# Patient Record
Sex: Female | Born: 1965
Health system: Southern US, Community
[De-identification: ages and names within clinical notes are randomized; demographics above are authoritative.]

## PROBLEM LIST (undated history)

## (undated) DIAGNOSIS — F1010 Alcohol abuse, uncomplicated: Secondary | ICD-10-CM

## (undated) DIAGNOSIS — F419 Anxiety disorder, unspecified: Secondary | ICD-10-CM

## (undated) DIAGNOSIS — F329 Major depressive disorder, single episode, unspecified: Secondary | ICD-10-CM

## (undated) DIAGNOSIS — L509 Urticaria, unspecified: Secondary | ICD-10-CM

## (undated) DIAGNOSIS — F32A Depression, unspecified: Secondary | ICD-10-CM

## (undated) DIAGNOSIS — Z720 Tobacco use: Secondary | ICD-10-CM

## (undated) DIAGNOSIS — F410 Panic disorder [episodic paroxysmal anxiety] without agoraphobia: Secondary | ICD-10-CM

## (undated) DIAGNOSIS — K259 Gastric ulcer, unspecified as acute or chronic, without hemorrhage or perforation: Secondary | ICD-10-CM

## (undated) DIAGNOSIS — E785 Hyperlipidemia, unspecified: Secondary | ICD-10-CM

## (undated) HISTORY — DX: Alcohol abuse, uncomplicated: F10.10

## (undated) HISTORY — DX: Urticaria, unspecified: L50.9

## (undated) HISTORY — DX: Depression, unspecified: F32.A

## (undated) HISTORY — DX: Gastric ulcer, unspecified as acute or chronic, without hemorrhage or perforation: K25.9

## (undated) HISTORY — DX: Major depressive disorder, single episode, unspecified: F32.9

## (undated) HISTORY — DX: Tobacco use: Z72.0

## (undated) HISTORY — DX: Hyperlipidemia, unspecified: E78.5

## (undated) HISTORY — DX: Panic disorder (episodic paroxysmal anxiety): F41.0

## (undated) HISTORY — PX: HERNIA REPAIR: SHX51

## (undated) HISTORY — DX: Anxiety disorder, unspecified: F41.9

---

## 2016-06-12 ENCOUNTER — Telehealth: Payer: Self-pay

## 2016-06-12 ENCOUNTER — Ambulatory Visit (INDEPENDENT_AMBULATORY_CARE_PROVIDER_SITE_OTHER): Payer: BC Managed Care – PPO | Admitting: Family Medicine

## 2016-06-12 ENCOUNTER — Encounter: Payer: Self-pay | Admitting: Family Medicine

## 2016-06-12 VITALS — BP 120/72 | HR 67 | Wt 168.2 lb

## 2016-06-12 DIAGNOSIS — F419 Anxiety disorder, unspecified: Secondary | ICD-10-CM | POA: Diagnosis not present

## 2016-06-12 DIAGNOSIS — F1010 Alcohol abuse, uncomplicated: Secondary | ICD-10-CM | POA: Diagnosis not present

## 2016-06-12 DIAGNOSIS — Z720 Tobacco use: Secondary | ICD-10-CM

## 2016-06-12 DIAGNOSIS — F410 Panic disorder [episodic paroxysmal anxiety] without agoraphobia: Secondary | ICD-10-CM

## 2016-06-12 DIAGNOSIS — R5383 Other fatigue: Secondary | ICD-10-CM | POA: Diagnosis not present

## 2016-06-12 DIAGNOSIS — F329 Major depressive disorder, single episode, unspecified: Secondary | ICD-10-CM

## 2016-06-12 DIAGNOSIS — F32A Depression, unspecified: Secondary | ICD-10-CM

## 2016-06-12 LAB — CBC WITH DIFFERENTIAL/PLATELET
BASOS ABS: 0 {cells}/uL (ref 0–200)
Basophils Relative: 0 %
EOS ABS: 108 {cells}/uL (ref 15–500)
Eosinophils Relative: 1 %
HEMATOCRIT: 37.8 % (ref 35.0–45.0)
Hemoglobin: 12.3 g/dL (ref 11.7–15.5)
LYMPHS PCT: 37 %
Lymphs Abs: 3996 cells/uL — ABNORMAL HIGH (ref 850–3900)
MCH: 27.5 pg (ref 27.0–33.0)
MCHC: 32.5 g/dL (ref 32.0–36.0)
MCV: 84.4 fL (ref 80.0–100.0)
MONO ABS: 648 {cells}/uL (ref 200–950)
MPV: 9.9 fL (ref 7.5–12.5)
Monocytes Relative: 6 %
NEUTROS PCT: 56 %
Neutro Abs: 6048 cells/uL (ref 1500–7800)
Platelets: 273 10*3/uL (ref 140–400)
RBC: 4.48 MIL/uL (ref 3.80–5.10)
RDW: 15.9 % — AB (ref 11.0–15.0)
WBC: 10.8 10*3/uL — AB (ref 4.0–10.5)

## 2016-06-12 LAB — COMPREHENSIVE METABOLIC PANEL
ALT: 21 U/L (ref 6–29)
AST: 15 U/L (ref 10–35)
Albumin: 4.4 g/dL (ref 3.6–5.1)
Alkaline Phosphatase: 48 U/L (ref 33–130)
BUN: 23 mg/dL (ref 7–25)
CHLORIDE: 107 mmol/L (ref 98–110)
CO2: 22 mmol/L (ref 20–31)
CREATININE: 0.95 mg/dL (ref 0.50–1.05)
Calcium: 9.6 mg/dL (ref 8.6–10.4)
Glucose, Bld: 89 mg/dL (ref 65–99)
Potassium: 4 mmol/L (ref 3.5–5.3)
SODIUM: 141 mmol/L (ref 135–146)
TOTAL PROTEIN: 7.3 g/dL (ref 6.1–8.1)
Total Bilirubin: 0.3 mg/dL (ref 0.2–1.2)

## 2016-06-12 LAB — POCT URINALYSIS DIPSTICK
BILIRUBIN UA: NEGATIVE
Blood, UA: NEGATIVE
Glucose, UA: NEGATIVE
KETONES UA: NEGATIVE
Nitrite, UA: NEGATIVE
PROTEIN UA: NEGATIVE
Spec Grav, UA: >=1.03
Urobilinogen, UA: NEGATIVE
pH, UA: 6

## 2016-06-12 LAB — TSH: TSH: 0.82 mIU/L

## 2016-06-12 LAB — POCT URINE PREGNANCY: Preg Test, Ur: NEGATIVE

## 2016-06-12 MED ORDER — BUPROPION HCL ER (XL) 150 MG PO TB24: 150.0000 mg | ORAL_TABLET | Freq: Every day | ORAL | 0 refills | Status: DC

## 2016-06-12 MED ORDER — BUPROPION HCL ER (XL) 300 MG PO TB24: 300.0000 mg | ORAL_TABLET | Freq: Every day | ORAL | 2 refills | Status: DC

## 2016-06-12 NOTE — Progress Notes (Signed)
Subjective:    Patient ID: Diana Conrad, female    DOB: 07/08/1966, 50 y.o.   MRN: [id number]  HPI Chief Complaint  Patient presents with  . new pt    new pt get established, need med refills.    She is a 50 year old female with a history of major depressive disorder, anxiety, panic attacks, smoker, seasonal allergic rhinitis, hyperlipidemia, fatigue, who is new to the practice and here to establish care.   States she has had depression and anxiety since at least 2003. States she has anger issues. Drinks alcohol to deal with them. She has been taking Wellbutrin but stopped it in August because she did not follow up with her PCP to get refills. She is not currently taking any medication.  She is upset that she has recently started smoking again.  Reports feeling tired all the time.  Denies SI or HI  States she has been on multiple psychotropic medications in the past including Prozac, Paxil, Celexa, Zoloft and Xanax. She states she did not like how they made her feel.  She also reports a history of self medicating with alcohol, marijuana and has borrowed anti-anxiety medications from family or friends.   [facility] medical care: [facility] and she moved here last Wednesday. States she is going through a divorce and needed to get away. She is living with her daughter for now.   Last CPE: May 2017.   LMP: October 2017. States her periods are irregular over the past year or so and she is having hot flashes.   Other providers: none Denies having a therapist and counselor.   States her mother has mental illness.   Social history: Lives with her daughter, works in [place].   Reviewed allergies, medications, past medical, surgical, family, and social history.  Past Medical History:  Diagnosis Date  . Alcohol abuse   . Anxiety   . Current occasional smoker   . Depression   . Hyperlipidemia   . Panic attacks   . Stomach ulcer    Past Surgical History:    Procedure Laterality Date  . HERNIA REPAIR     [facility]  Decreased Interest 3  Down, Depressed, Hopeless 3  PHQ - 2 Score 6  Altered sleeping 3  Tired, decreased energy 3  Change in appetite 3  Feeling bad or failure about yourself  3  Trouble concentrating 3  Moving slowly or fidgety/restless 0  Suicidal thoughts 1  PHQ-9 Score 22  Difficult doing work/chores Very difficult      Review of Systems Pertinent positives and negatives in the history of present illness.     Objective:   Physical Exam  Constitutional: She is oriented to person, place, and time. She appears well-developed and well-nourished. No distress.  Eyes: Conjunctivae are normal. Pupils are equal, round, and reactive to light.  Neck: Normal range of motion. Neck supple. No thyromegaly present.  Cardiovascular: Normal rate, regular rhythm, normal heart sounds and intact distal pulses.  Exam reveals no gallop and no friction rub.   No murmur heard. Pulmonary/Chest: Effort normal and breath sounds normal.  Lymphadenopathy:    She has no cervical adenopathy.  Neurological: She is alert and oriented to person, place, and time.  Skin: Skin is warm and dry. No pallor.  Psychiatric: She has a normal mood and affect. Her behavior is normal. Judgment and thought content normal. She expresses no homicidal and no suicidal ideation.   BP 120/72  Pulse 67   Wt 168 lb 3.2 oz (76.3 kg)       Assessment & Plan:  Depression, unspecified depression type - Plan: Comprehensive metabolic panel, CBC with Differential/Platelet, Ambulatory referral to Psychology  Alcohol abuse - Plan: Ambulatory referral to Psychology  Anxiety - Plan: Comprehensive metabolic panel, CBC with Differential/Platelet, TSH, Ambulatory referral to Psychology, Urinalysis Dipstick  Current occasional smoker  Fatigue, unspecified type - Plan: Comprehensive metabolic panel, CBC with Differential/Platelet, POCT urine  pregnancy, TSH  Panic attacks  Per patient request I am starting her back on Wellbutrin. She thinks this helped her with depression, smoking and helped her focus but has not been taking it since August.  Referral made to psychiatry and counseling. She states she has never been referred for counseling in the past. She appears to have a complex history of multiple psychiatric issues. She is willing to make these appointments. She does not appear to be suicidal or homicidal but she does admit to having an anger issue. She also appears to have some underlying addiction issues to alcohol and is self medicating.  She has support at home in her daughter who is some type of nurse.  Plan to check labs and look for underlying physiogical issues.  Will have her follow up in 2 weeks.

## 2016-06-12 NOTE — Telephone Encounter (Signed)
Records placed in your folder for review. Pt has appt today. Trixie Rude/RLB

## 2016-06-12 NOTE — Patient Instructions (Addendum)
You can call to schedule your appointment with the psychiatrist. A few offices are listed below for you to call.   [facility]  [address], [address], [place], [place] [address]  Phone: [phone]  [facility] [address] [place], [place] [address]  Phone: [phone]   Also, please call and schedule with a counselor:  You can call to schedule your appointment with the Counselor. A couple offices are listed below for you to call.   [facility] [address] [place], [place] [phone]  [facility]  [address], [place], [place] [address] Phone: [phone]   Bupropion extended-release tablets (Depression/Mood Disorders) What is this medicine? BUPROPION (byoo PROE pee on) is used to treat depression. This medicine may be used for other purposes; ask your health care provider or pharmacist if you have questions. What should I tell my health care provider before I take this medicine? They need to know if you have any of these conditions: -an eating disorder, such as anorexia or bulimia -bipolar disorder or psychosis -diabetes or high blood sugar, treated with medication -glaucoma -head injury or brain tumor -heart disease, previous heart attack, or irregular heart beat -high blood pressure -kidney or liver disease -seizures (convulsions) -suicidal thoughts or a previous suicide attempt -Tourette's syndrome -weight loss -an unusual or allergic reaction to bupropion, other medicines, foods, dyes, or preservatives -breast-feeding -pregnant or trying to become pregnant How should I use this medicine? Take this medicine by mouth with a glass of water. Follow the directions on the prescription label. You can take it with or without food. If it upsets your stomach, take it with food. Do not crush, chew, or cut these tablets. This medicine is taken once daily at the  same time each day. Do not take your medicine more often than directed. Do not stop taking this medicine suddenly except upon the advice of your doctor. Stopping this medicine too quickly may cause serious side effects or your condition may worsen. A special MedGuide will be given to you by the pharmacist with each prescription and refill. Be sure to read this information carefully each time. Talk to your pediatrician regarding the use of this medicine in children. Special care may be needed. Overdosage: If you think you have taken too much of this medicine contact a poison control center or emergency room at once. NOTE: This medicine is only for you. Do not share this medicine with others. What if I miss a dose? If you miss a dose, skip the missed dose and take your next tablet at the regular time. Do not take double or extra doses. What may interact with this medicine? Do not take this medicine with any of the following medications: -linezolid -MAOIs like Azilect, Carbex, Eldepryl, Marplan, Nardil, and Parnate -methylene blue (injected into a vein) -other medicines that contain bupropion like Zyban This medicine may also interact with the following medications: -alcohol -certain medicines for anxiety or sleep -certain medicines for blood pressure like metoprolol, propranolol -certain medicines for depression or psychotic disturbances -certain medicines for HIV or AIDS like efavirenz, lopinavir, nelfinavir, ritonavir -certain medicines for irregular heart beat like propafenone, flecainide -certain medicines for Parkinson's disease like amantadine, levodopa -certain medicines for seizures like carbamazepine, phenytoin, phenobarbital -cimetidine -clopidogrel -cyclophosphamide -furazolidone -isoniazid -nicotine -orphenadrine -procarbazine -steroid medicines like prednisone or cortisone -stimulant medicines for attention disorders, weight loss, or to stay  awake -tamoxifen -theophylline -thiotepa -  ticlopidine -tramadol -warfarin This list may not describe all possible interactions. Give your health care provider a list of all the medicines, herbs, non-prescription drugs, or dietary supplements you use. Also tell them if you smoke, drink alcohol, or use illegal drugs. Some items may interact with your medicine. What should I watch for while using this medicine? Tell your doctor if your symptoms do not get better or if they get worse. Visit your doctor or health care professional for regular checks on your progress. Because it may take several weeks to see the full effects of this medicine, it is important to continue your treatment as prescribed by your doctor. Patients and their families should watch out for new or worsening thoughts of suicide or depression. Also watch out for sudden changes in feelings such as feeling anxious, agitated, panicky, irritable, hostile, aggressive, impulsive, severely restless, overly excited and hyperactive, or not being able to sleep. If this happens, especially at the beginning of treatment or after a change in dose, call your health care professional. Avoid alcoholic drinks while taking this medicine. Drinking large amounts of alcoholic beverages, using sleeping or anxiety medicines, or quickly stopping the use of these agents while taking this medicine may increase your risk for a seizure. Do not drive or use heavy machinery until you know how this medicine affects you. This medicine can impair your ability to perform these tasks. Do not take this medicine close to bedtime. It may prevent you from sleeping. Your mouth may get dry. Chewing sugarless gum or sucking hard candy, and drinking plenty of water may help. Contact your doctor if the problem does not go away or is severe. The tablet shell for some brands of this medicine does not dissolve. This is normal. The tablet shell may appear whole in the stool. This is  not a cause for concern. What side effects may I notice from receiving this medicine? Side effects that you should report to your doctor or health care professional as soon as possible: -allergic reactions like skin rash, itching or hives, swelling of the face, lips, or tongue -breathing problems -changes in vision -confusion -fast or irregular heartbeat -hallucinations -increased blood pressure -redness, blistering, peeling or loosening of the skin, including inside the mouth -seizures -suicidal thoughts or other mood changes -unusually weak or tired -vomiting Side effects that usually do not require medical attention (report to your doctor or health care professional if they continue or are bothersome): -change in sex drive or performance -constipation -headache -loss of appetite -nausea -tremors -weight loss This list may not describe all possible side effects. Call your doctor for medical advice about side effects. You may report side effects to FDA at 1-800-FDA-1088. Where should I keep my medicine? Keep out of the reach of children. Store at room temperature between 15 and 30 degrees C (59 and 86 degrees F). Throw away any unused medicine after the expiration date. NOTE: This sheet is a summary. It may not cover all possible information. If you have questions about this medicine, talk to your doctor, pharmacist, or health care provider.    2016, Elsevier/Gold Standard. (2013-02-10 12:39:42)

## 2016-06-29 ENCOUNTER — Ambulatory Visit: Payer: BC Managed Care – PPO | Admitting: Family Medicine

## 2016-06-29 ENCOUNTER — Telehealth: Payer: Self-pay

## 2016-06-29 NOTE — Telephone Encounter (Signed)
Follow up recommended in the next 2 weeks.

## 2016-06-29 NOTE — Telephone Encounter (Signed)

## 2016-07-01 ENCOUNTER — Ambulatory Visit (INDEPENDENT_AMBULATORY_CARE_PROVIDER_SITE_OTHER): Payer: BC Managed Care – PPO | Admitting: Physician Assistant

## 2016-07-01 ENCOUNTER — Encounter: Payer: Self-pay | Admitting: Physician Assistant

## 2016-07-01 VITALS — BP 110/74 | HR 88 | Ht 59.0 in | Wt 165.0 lb

## 2016-07-01 DIAGNOSIS — Z131 Encounter for screening for diabetes mellitus: Secondary | ICD-10-CM

## 2016-07-01 DIAGNOSIS — Z6833 Body mass index (BMI) 33.0-33.9, adult: Secondary | ICD-10-CM

## 2016-07-01 DIAGNOSIS — E6609 Other obesity due to excess calories: Secondary | ICD-10-CM

## 2016-07-01 DIAGNOSIS — F419 Anxiety disorder, unspecified: Secondary | ICD-10-CM

## 2016-07-01 DIAGNOSIS — Z13220 Encounter for screening for lipoid disorders: Secondary | ICD-10-CM

## 2016-07-01 DIAGNOSIS — F900 Attention-deficit hyperactivity disorder, predominantly inattentive type: Secondary | ICD-10-CM

## 2016-07-01 DIAGNOSIS — Z1231 Encounter for screening mammogram for malignant neoplasm of breast: Secondary | ICD-10-CM

## 2016-07-01 MED ORDER — AMPHETAMINE-DEXTROAMPHET ER 10 MG PO CP24: 10.0000 mg | ORAL_CAPSULE | Freq: Every day | ORAL | 0 refills | Status: DC

## 2016-07-01 NOTE — Patient Instructions (Signed)
belviq for weight loss.  topamax for weigh loss.

## 2016-07-01 NOTE — Progress Notes (Signed)
Subjective:    Patient ID: Madolyn Friezeamara Byard, female    DOB: 05/31/1966, 50 y.o.   MRN: [id number]  HPI  Pt is a 50 yo female who presents to the clinic to establish care.  .. Active Ambulatory Problems    Diagnosis Date Noted  . Depression   . Alcohol abuse   . Anxiety   . Panic attacks   . Current occasional smoker   . Class 1 obesity due to excess calories without serious comorbidity with body mass index (BMI) of 33.0 to 33.9 in adult 07/03/2016  . Attention deficit hyperactivity disorder (ADHD), predominantly inattentive type 07/03/2016   Resolved Ambulatory Problems    Diagnosis Date Noted  . No Resolved Ambulatory Problems   Past Medical History:  Diagnosis Date  . Alcohol abuse   . Anxiety   . Current occasional smoker   . Depression   . Hyperlipidemia   . Panic attacks   . Stomach ulcer    .[name]. Family History  Problem Relation Age of Onset  . Depression Mother   . Alcohol abuse Father   . Cancer Father     colon  . Diabetes Sister   . Diabetes Brother    .[name]. Social History   Social History  . Marital status: Legally Separated    Spouse name: N/A  . Number of children: N/A  . Years of education: N/A   Occupational History  . Not on file.   Social History Main Topics  . Smoking status: Current Every Day Smoker  . Smokeless tobacco: Never Used     Comment: has quit in past. smokes with drinking  . Alcohol use 2.4 - 3.6 oz/week    4 - 6 Cans of beer per week     Comment: on weekend days   . Drug use: No  . Sexual activity: Yes   Other Topics Concern  . Not on file   Social History Narrative  . No narrative on file   Her main concern today is making sure she is overall healthy and working on weight loss.   Mood is controlled on wellbutrin.   She has not been on ADD medication in a few years due to cost but now she has insurance. She feels like getting back on this medication could help her get more motivated and more productive.   For weight  loss she has tried saxenda with no weight loss results. She is not activley exercising or dieting.     Review of Systems  All other systems reviewed and are negative.      Objective:   Physical Exam  Constitutional: She is oriented to person, place, and time. She appears well-developed and well-nourished.  HENT:  Head: Normocephalic and atraumatic.  Neck: Normal range of motion. Neck supple. No thyromegaly present.  Cardiovascular: Normal rate, regular rhythm and normal heart sounds.   Pulmonary/Chest: Effort normal and breath sounds normal.  Neurological: She is alert and oriented to person, place, and time.  Psychiatric: She has a normal mood and affect. Her behavior is normal.          Assessment & Plan:  [name].[name].Delaney Meigsamara was seen today for establish care and inattention.  Diagnoses and all orders for this visit:  Class 1 obesity due to excess calories without serious comorbidity with body mass index (BMI) of 33.0 to 33.9 in adult  Screening for lipid disorders -     Lipid panel  Screening for diabetes mellitus -  COMPLETE METABOLIC PANEL WITH GFR  Visit for screening mammogram -     MM DIGITAL SCREENING BILATERAL; Future -     MM DIGITAL SCREENING BILATERAL  Attention deficit hyperactivity disorder (ADHD), predominantly inattentive type -     amphetamine-dextroamphetamine (ADDERALL XR) 10 MG 24 hr capsule; Take 1 capsule (10 mg total) by mouth daily.  Anxiety   Continue on wellbutrin.  Get back on ADD medications. Started adderall XR 10mg  today.  Start tracking calories with my fitness pal goal of 1500mg   Get fibit start tracking steps with goal time of 150minutes a week.  Follow up in 1 month.

## 2016-07-02 LAB — COMPLETE METABOLIC PANEL WITH GFR
ALT: 19 U/L (ref 6–29)
AST: 16 U/L (ref 10–35)
Albumin: 4.3 g/dL (ref 3.6–5.1)
Alkaline Phosphatase: 57 U/L (ref 33–130)
BILIRUBIN TOTAL: 0.5 mg/dL (ref 0.2–1.2)
BUN: 21 mg/dL (ref 7–25)
CHLORIDE: 101 mmol/L (ref 98–110)
CO2: 25 mmol/L (ref 20–31)
Calcium: 9.6 mg/dL (ref 8.6–10.4)
Creat: 0.73 mg/dL (ref 0.50–1.05)
GFR, Est African American: >89 mL/min (ref >=60)
GLUCOSE: 81 mg/dL (ref 65–99)
POTASSIUM: 4 mmol/L (ref 3.5–5.3)
SODIUM: 135 mmol/L (ref 135–146)
TOTAL PROTEIN: 7.5 g/dL (ref 6.1–8.1)

## 2016-07-02 LAB — LIPID PANEL
Cholesterol: 214 mg/dL — ABNORMAL HIGH (ref <200)
HDL: 87 mg/dL (ref >50)
LDL CALC: 118 mg/dL — AB (ref <100)
Total CHOL/HDL Ratio: 2.5 Ratio (ref <5.0)
Triglycerides: 46 mg/dL (ref <150)
VLDL: 9 mg/dL (ref <30)

## 2016-07-03 ENCOUNTER — Encounter: Payer: Self-pay | Admitting: Family Medicine

## 2016-07-03 ENCOUNTER — Telehealth: Payer: Self-pay | Admitting: *Deleted

## 2016-07-03 ENCOUNTER — Encounter: Payer: Self-pay | Admitting: Physician Assistant

## 2016-07-03 DIAGNOSIS — E6609 Other obesity due to excess calories: Secondary | ICD-10-CM | POA: Insufficient documentation

## 2016-07-03 DIAGNOSIS — Z6833 Body mass index (BMI) 33.0-33.9, adult: Principal | ICD-10-CM

## 2016-07-03 DIAGNOSIS — F900 Attention-deficit hyperactivity disorder, predominantly inattentive type: Secondary | ICD-10-CM | POA: Insufficient documentation

## 2016-07-03 NOTE — Telephone Encounter (Signed)
No show letter with appt request sent 07/03/2016

## 2016-07-03 NOTE — Telephone Encounter (Signed)
PA submitted over the phone approved.PA Berkley Harveyauth # is [id number]  Patient notified and pharm notified

## 2016-07-10 ENCOUNTER — Telehealth: Payer: Self-pay | Admitting: Family Medicine

## 2016-07-10 NOTE — Telephone Encounter (Signed)
No show letter sent was returned

## 2016-07-13 ENCOUNTER — Telehealth: Payer: Self-pay

## 2016-07-13 NOTE — Telephone Encounter (Signed)
Letter for work. Antonietta Lansdowne Strehl,CMA

## 2016-07-29 ENCOUNTER — Other Ambulatory Visit: Payer: Self-pay

## 2016-07-29 ENCOUNTER — Ambulatory Visit (INDEPENDENT_AMBULATORY_CARE_PROVIDER_SITE_OTHER): Payer: BC Managed Care – PPO

## 2016-07-29 ENCOUNTER — Encounter: Payer: Self-pay | Admitting: Physician Assistant

## 2016-07-29 ENCOUNTER — Ambulatory Visit (INDEPENDENT_AMBULATORY_CARE_PROVIDER_SITE_OTHER): Payer: BC Managed Care – PPO | Admitting: Physician Assistant

## 2016-07-29 VITALS — BP 120/73 | HR 83 | Ht 59.0 in | Wt 166.0 lb

## 2016-07-29 DIAGNOSIS — F900 Attention-deficit hyperactivity disorder, predominantly inattentive type: Secondary | ICD-10-CM

## 2016-07-29 DIAGNOSIS — Z1231 Encounter for screening mammogram for malignant neoplasm of breast: Secondary | ICD-10-CM

## 2016-07-29 DIAGNOSIS — E6609 Other obesity due to excess calories: Secondary | ICD-10-CM

## 2016-07-29 DIAGNOSIS — Z6833 Body mass index (BMI) 33.0-33.9, adult: Secondary | ICD-10-CM | POA: Diagnosis not present

## 2016-07-29 MED ORDER — AMPHETAMINE-DEXTROAMPHET ER 30 MG PO CP24: 30.0000 mg | ORAL_CAPSULE | Freq: Every day | ORAL | 0 refills | Status: DC

## 2016-07-29 MED ORDER — LORCASERIN HCL 10 MG PO TABS: 1.0000 | ORAL_TABLET | Freq: Two times a day (BID) | ORAL | 1 refills | Status: DC

## 2016-07-29 NOTE — Progress Notes (Signed)
   Subjective:    Patient ID: Diana Conrad, female    DOB: 04/21/1966, 50 y.o.   MRN: [id number]  HPI  Pt is a 50 yo female who presents to the clinic for 1 month follow up on weight and ADHD.  ADHD- she does not feel like the 10mg  is helping at all. She feels very little benefit. She is still struggling completing task.   She has gained one pound in last month. This was over christmas holiday. She does report to cutting back on carbs and fatty foods. She does admit to eating 8 small or 1 big bag of chips a day. She feels like she has cravings it is hard for her to control. She did get a fit bit and walking 6-8,000 steps a day.    Review of Systems  All other systems reviewed and are negative.      Objective:   Physical Exam  Constitutional: She is oriented to person, place, and time. She appears well-developed and well-nourished.  Obese.   HENT:  Head: Normocephalic and atraumatic.  Cardiovascular: Normal rate, regular rhythm and normal heart sounds.   Pulmonary/Chest: Effort normal and breath sounds normal.  Neurological: She is alert and oriented to person, place, and time.  Psychiatric: She has a normal mood and affect. Her behavior is normal.          Assessment & Plan:  [name].[name].Diana Conrad was seen today for adhd and obesity.  Diagnoses and all orders for this visit:  Class 1 obesity due to excess calories without serious comorbidity with body mass index (BMI) of 33.0 to 33.9 in adult -     Lorcaserin HCl 10 MG TABS; Take 1 tablet by mouth 2 (two) times daily.  Attention deficit hyperactivity disorder (ADHD), predominantly inattentive type -     amphetamine-dextroamphetamine (ADDERALL XR) 30 MG 24 hr capsule; Take 1 capsule (30 mg total) by mouth daily.   Will try belviq. Discuss chips and craving.  congrats on fit bit set goal to 10,000.  Set caloric goal to 2000 at first.  Follow up in 2 months for weight check.   Increased adderall. Call in 2 weeks and let me know  how doing.

## 2016-07-31 ENCOUNTER — Telehealth: Payer: Self-pay | Admitting: Physician Assistant

## 2016-07-31 NOTE — Telephone Encounter (Signed)
Received approval from insurance for Belviq. Valid: 07/2916-10/29/16. Pharmacy notified.

## 2016-07-31 NOTE — Telephone Encounter (Signed)
Received fax for prior authorization on Belviq sent through cover my meds waiting on determination. - CF °

## 2016-08-21 NOTE — Progress Notes (Signed)
Call pt: mammogram normal. Follow up in 1 year.

## 2016-08-27 ENCOUNTER — Telehealth: Payer: Self-pay

## 2016-08-27 MED ORDER — AMPHETAMINE-DEXTROAMPHETAMINE 30 MG PO TABS: 30.0000 mg | ORAL_TABLET | Freq: Every day | ORAL | 0 refills | Status: DC

## 2016-08-27 NOTE — Telephone Encounter (Signed)
Pt is requesting a refill of her Adderall. She states that during her last visit with Lesly RubensteinJade she was instructed to update her through mychart about if the Adderall XR 30 mg was working for her.  She is wanting a dose change.

## 2016-08-27 NOTE — Telephone Encounter (Signed)
I discussed the case with Diana Conrad, switching to standard release Adderall so it doesn't keep her up at night.

## 2016-09-10 ENCOUNTER — Other Ambulatory Visit: Payer: Self-pay | Admitting: *Deleted

## 2016-09-10 MED ORDER — OSELTAMIVIR PHOSPHATE 75 MG PO CAPS: 75.0000 mg | ORAL_CAPSULE | Freq: Every day | ORAL | 0 refills | Status: AC

## 2016-09-15 ENCOUNTER — Telehealth: Payer: Self-pay | Admitting: Physician Assistant

## 2016-09-15 MED ORDER — BUPROPION HCL ER (XL) 300 MG PO TB24: 300.0000 mg | ORAL_TABLET | Freq: Every day | ORAL | 2 refills | Status: DC

## 2016-09-15 MED ORDER — AMPHETAMINE-DEXTROAMPHETAMINE 30 MG PO TABS: 30.0000 mg | ORAL_TABLET | Freq: Two times a day (BID) | ORAL | 0 refills | Status: DC

## 2016-09-15 NOTE — Telephone Encounter (Signed)
Pt calls and needs refills of adderall and wellbutrin to get to appt on 09/25/16. Sent to pharmacy.

## 2016-09-29 ENCOUNTER — Ambulatory Visit (INDEPENDENT_AMBULATORY_CARE_PROVIDER_SITE_OTHER): Payer: BC Managed Care – PPO | Admitting: Physician Assistant

## 2016-09-29 ENCOUNTER — Encounter: Payer: Self-pay | Admitting: Physician Assistant

## 2016-09-29 VITALS — BP 112/69 | HR 111 | Ht 59.0 in | Wt 155.0 lb

## 2016-09-29 DIAGNOSIS — H9193 Unspecified hearing loss, bilateral: Secondary | ICD-10-CM

## 2016-09-29 DIAGNOSIS — F900 Attention-deficit hyperactivity disorder, predominantly inattentive type: Secondary | ICD-10-CM | POA: Diagnosis not present

## 2016-09-29 DIAGNOSIS — E6609 Other obesity due to excess calories: Secondary | ICD-10-CM

## 2016-09-29 DIAGNOSIS — Z6833 Body mass index (BMI) 33.0-33.9, adult: Secondary | ICD-10-CM

## 2016-09-29 MED ORDER — AMPHETAMINE-DEXTROAMPHETAMINE 30 MG PO TABS: 30.0000 mg | ORAL_TABLET | Freq: Two times a day (BID) | ORAL | 0 refills | Status: DC

## 2016-09-29 MED ORDER — LORCASERIN HCL 10 MG PO TABS: 1.0000 | ORAL_TABLET | Freq: Two times a day (BID) | ORAL | 2 refills | Status: DC

## 2016-09-29 NOTE — Addendum Note (Signed)
Addended by: Jomarie LongsBREEBACK, Caylyn Tedeschi L on: 09/29/2016 04:45 PM   Modules accepted: Orders

## 2016-09-29 NOTE — Progress Notes (Addendum)
   Subjective:    Patient ID: Diana Conrad, female    DOB: 01/30/1966, 51 y.o.   MRN: [id number]  HPI  Pt is a 51 yo female who presents to the clinic for 3 month follow up on ADHD and obesity.   ADHD-doing well. We switched to immediate release and doing much better. She denies any insomnia, anxiety, or side effects.   Obesity- on belviq. Lost 11lbs. Walking 12,000 steps a day. She is watching her calories.   She has had hearing loss for many years. She is finding it more difficult to cope with. Children at school are stating she can't hear them. She would like referral. Left ear seems to be worse than right.    Review of Systems  All other systems reviewed and are negative.      Objective:   Physical Exam  Constitutional: She is oriented to person, place, and time. She appears well-developed and well-nourished.  HENT:  Head: Normocephalic and atraumatic.  Cardiovascular: Normal rate, regular rhythm and normal heart sounds.   Pulmonary/Chest: Effort normal and breath sounds normal.  Neurological: She is alert and oriented to person, place, and time.  Psychiatric: She has a normal mood and affect. Her behavior is normal.          Assessment & Plan:  [name].[name].Diagnoses and all orders for this visit:  Attention deficit hyperactivity disorder (ADHD), predominantly inattentive type -     amphetamine-dextroamphetamine (ADDERALL) 30 MG tablet; Take 1 tablet by mouth 2 (two) times daily. Do not refill until 11/24/16.  Class 1 obesity due to excess calories without serious comorbidity with body mass index (BMI) of 33.0 to 33.9 in adult -     Lorcaserin HCl 10 MG TABS; Take 1 tablet by mouth 2 (two) times daily.  Hearing impaired person, bilateral -     Ambulatory referral to Audiology  Other orders -     Discontinue: amphetamine-dextroamphetamine (ADDERALL) 30 MG tablet; Take 1 tablet by mouth 2 (two) times daily. -     Discontinue: amphetamine-dextroamphetamine (ADDERALL) 30 MG  tablet; Take 1 tablet by mouth 2 (two) times daily. Do not refill until 10/25/16.   Refilled for 3 months.  Continue beliviq. Continue to work on regular exercise and diet changes.

## 2016-10-20 ENCOUNTER — Telehealth: Payer: Self-pay | Admitting: *Deleted

## 2016-10-20 NOTE — Telephone Encounter (Signed)
Pre Authorization sent to cover my meds. [id number]

## 2016-10-28 ENCOUNTER — Encounter: Payer: Self-pay | Admitting: Internal Medicine

## 2016-12-08 ENCOUNTER — Telehealth: Payer: Self-pay | Admitting: Physician Assistant

## 2016-12-08 NOTE — Telephone Encounter (Signed)
Received PA for Belviq sent through cover my meds and received authorization from 12/08/16 - 03/10/17. I will call pharmacy and let them know of authorization. - CF  PA# [facility] [phone] [id number] Grand[employer]athered [id number] DJ

## 2016-12-16 ENCOUNTER — Ambulatory Visit: Payer: BC Managed Care – PPO | Attending: Physician Assistant | Admitting: Audiology

## 2016-12-16 DIAGNOSIS — H903 Sensorineural hearing loss, bilateral: Secondary | ICD-10-CM

## 2016-12-16 DIAGNOSIS — H9193 Unspecified hearing loss, bilateral: Secondary | ICD-10-CM | POA: Insufficient documentation

## 2016-12-16 DIAGNOSIS — H93299 Other abnormal auditory perceptions, unspecified ear: Secondary | ICD-10-CM | POA: Diagnosis present

## 2016-12-16 DIAGNOSIS — R292 Abnormal reflex: Secondary | ICD-10-CM | POA: Diagnosis present

## 2016-12-16 NOTE — Procedures (Signed)
[facility]  [address]  [place], [place] [address]  [phone]   Audiological Evaluation  Patient Name: Diana Conrad  Status: Outpatient   DOB: 09/05/1965    Diagnosis: Hearing Loss                 MRN: [id number] Date:  12/16/2016     Referent: Jomarie LongsBreeback, Jade L, PA-C  History: Diana Conrad was seen for an audiological evaluation. Accompanied by: self Primary Concern: Trouble hearing. Having difficulty hearing someone sitting next to her. 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  Pain: None History of hearing problems: Y - for a long time. My children used to tell me I needed my hearing checked. History of ear infections:  Y - none recently History of ear surgery or "tubes" : N History of dizziness/vertigo:   Y / N History of balance issues:  Y / N Tinnitus: Occational - a sharp sound and "I always hear a sound when it is quiet" - a lower pitch constant sound Sound sensitivity: N History of occupational noise exposure: Worked in Designer, fashion/clothingtextiles with loud machines x 10 years, but wore hearing protection. History of hypertension: N History of diabetes:  N Family history of hearing loss:  N Medications:    Evaluation: Conventional pure tone audiometry from 250Hz  - 8000Hz  with using insert earphones.  Hearing Thresholds show a symmetrical hearing loss that is senorineural ranging from 25 dBHL at 250Hz  - 500Hz  sloping to 35 dBHL at 1000Hz ; 50 dBHL at 2000Hand and 50-60 dBHL from 3000Hz  - 8000Hz  bilaterally. Reliability is good Speech reception levels (repeating words near threshold) using recorded spondee word lists:  Right ear: 35 dBHL.  Left ear:  40 dBHL Word recognition (at comfortably loud volumes) using recorded NU-6 word lists, in quiet.  Right ear: 56% at 75 dBHL.  Left ear:   60% at 80 dBHL. Word recognition in minimal background noise using  PBK words in multitalker noise:  +5 dBHL  Right ear: 26%                              Left ear:  16%  Tympanometry (middle ear function) shows normal middle ear volume, pressure and compliance with ipsilateral acoustic reflexes ranging from present to no respone - poorer on the right side.   CONCLUSION:  Diana Conrad has borderline normal low frequency hearing sloping to a moderately -severe high frequency sensorineural hearing loss bilaterally. Diana Conrad has poor word recognition in quiet at loud conversational speech levels, in quiet. In minimal background noise her word recognition drops to extremely poor in each ear. Difficulty hearing in most social and vocational situations is expected. Diana Conrad needs a hearing aid evaluation as soon as possible. To help with the purchase of the the hearing aid several options were discussed and are summarized below. The test results were discussed and Diana Conrad counseled.   RECOMMENDATIONS: 1.   Monitor hearing closely with a repeat audiological evaluation in 3 months (earlier if there is any change in hearing or ear pressure).  This appointment may be scheduled here, with an ENT or a hearing aid audiologist. 2.   Consider further evaluation by an Ear, Nose and Throat physician because of the degree of hearing loss and poor word recognition in quiet and in background noise for a  51 year old. 3.   A hearing aid evaluation.  Recommendations to help with amplification:   A) Check with insurance regarding hearing aid benefit.   B) Vocational Rehabilitation for hearing aid assistance   [phone]  C) Contact [employer] regarding hearing aids. They participate with a one free hearing aid program through the state of  and/or check on amplification information.  D) Get help from [employer] for at home and ipad app for communication. Communication was initiated during today's visit. 4.  Strategies that help improve hearing include: A) Face the  speaker directly. Optimal is having the speakers face well - lit.  Unless amplified, being within 3-6 feet of the speaker will enhance word recognition. B) Avoid having the speaker back-lit as this will minimize the ability to use cues from lip-reading, facial expression and gestures. C)  Word recognition is poorer in background noise. For optimal word recognition, turn off the TV, radio or noisy fan when engaging in conversation. In a restaurant, try to sit away from noise sources and close to the primary speaker.  D)  Ask for topic clarification from time to time in order to remain in the conversation.  Most people don't mind repeating or clarifying a point when asked.  If needed, explain the difficulty hearing in background noise or hearing loss. 5.   Use hearing protection during noisy activities such as using a weed eater, moving the lawn, shooting, etc.    Musician's plugs, are available from [employer] for music related hearing protection because there is no distortion.  Other hearing protection, such as sponge plugs (available at pharmacies) or earmuffs (available at sporting goods stores or department stores such as [employer]) are useful for noisy activities and venues. 6.   Amplification helps make the signal louder and therefore often improves hearing and word recognition.  Amplification has many forms including hearing aids in one or both ears, an assistive listening device which have a microphone and speaker such as a small handheld device and/or even a surround sound system of speakers.  Amplification may be covered by some insurances, but not all.  It is important to note that hearing aids must be individually fit according to the hearing test results and the ear shape.  Audiologists and hearing aid dealers in [place] must be licensed in order to dispense hearing aids.  In addition, a trial period is mandated by law in our state because often amplification must be tried and then evaluated in  order to determine benefit.  There are many excellent choices when it comes to amplification in our area and providers are listed in the phone book under hearing aids, there are audiologists in private practice, those affiliated with Ear, Nose and Throat physicians, and there are audiologists located at [facility].  Important- if a referral is recommended, it is important that you follow-up to ensure the referral is made. Please call the physician's office to confirm that you want the referral and to find out where the referral will be made.   Deborah L. Kate Sable, Au.D., CCC-A Doctor of Audiology 12/16/2016  cc: Jomarie Longs, PA-C

## 2016-12-22 ENCOUNTER — Encounter: Payer: Self-pay | Admitting: Physician Assistant

## 2016-12-22 ENCOUNTER — Ambulatory Visit (INDEPENDENT_AMBULATORY_CARE_PROVIDER_SITE_OTHER): Payer: BC Managed Care – PPO | Admitting: Physician Assistant

## 2016-12-22 VITALS — BP 118/74 | HR 109 | Ht 59.0 in | Wt 144.0 lb

## 2016-12-22 DIAGNOSIS — R4586 Emotional lability: Secondary | ICD-10-CM

## 2016-12-22 DIAGNOSIS — F39 Unspecified mood [affective] disorder: Secondary | ICD-10-CM

## 2016-12-22 DIAGNOSIS — E6609 Other obesity due to excess calories: Secondary | ICD-10-CM

## 2016-12-22 DIAGNOSIS — Z6833 Body mass index (BMI) 33.0-33.9, adult: Secondary | ICD-10-CM

## 2016-12-22 DIAGNOSIS — F331 Major depressive disorder, recurrent, moderate: Secondary | ICD-10-CM

## 2016-12-22 DIAGNOSIS — H903 Sensorineural hearing loss, bilateral: Secondary | ICD-10-CM | POA: Diagnosis not present

## 2016-12-22 DIAGNOSIS — F900 Attention-deficit hyperactivity disorder, predominantly inattentive type: Secondary | ICD-10-CM

## 2016-12-22 MED ORDER — AMPHETAMINE-DEXTROAMPHETAMINE 30 MG PO TABS: 30.0000 mg | ORAL_TABLET | Freq: Two times a day (BID) | ORAL | 0 refills | Status: DC

## 2016-12-22 MED ORDER — BUPROPION HCL ER (XL) 300 MG PO TB24: 300.0000 mg | ORAL_TABLET | Freq: Every day | ORAL | 2 refills | Status: DC

## 2016-12-22 MED ORDER — LORCASERIN HCL 10 MG PO TABS: 1.0000 | ORAL_TABLET | Freq: Two times a day (BID) | ORAL | 2 refills | Status: DC

## 2016-12-22 NOTE — Progress Notes (Signed)
Subjective:    Patient ID: Diana Conrad, female    DOB: Nov 08, 1965, 51 y.o.   MRN: [id number]  HPI  Pt is a 51 yo female who presents to the clinic to follow up after audiology, ADHD and on weight loss.   CONCLUSION:  Kendalyn Cranfield has borderline normal low frequency hearing sloping to a moderately -severe high frequency sensorineural hearing loss bilaterally. Eliani has poor word recognition in quiet at loud conversational speech levels, in quiet. In minimal background noise her word recognition drops to extremely poor in each ear. Difficulty hearing in most social and vocational situations is expected. Cortina Vultaggio needs a hearing aid evaluation as soon as possible. To help with the purchase of the the hearing aid several options were discussed and are summarized below. The test results were discussed and Kailie Polus counseled.   RECOMMENDATIONS: 1.   Monitor hearing closely with a repeat audiological evaluation in 3 months (earlier if there is any change in hearing or ear pressure).  This appointment may be scheduled here, with an ENT or a hearing aid audiologist. 2.   Consider further evaluation by an Ear, Nose and Throat physician because of the degree of hearing loss and poor word recognition in quiet and in background noise for a 51 year old. 3.   A hearing aid evaluation.  Recommendations to help with amplification:              A) Check with insurance regarding hearing aid benefit.              B) Vocational Rehabilitation for hearing aid assistance   [phone]             C) Contact [employer] regarding hearing aids. They participate with a one free hearing aid program through the state of [place] and/or check on amplification information.             D) Get help from [employer] for at home and ipad app for communication. Communication was initiated during today's visit. 4.  Strategies that help improve hearing include: A) Face the speaker directly. Optimal is having the  speakers face well - lit.  Unless amplified, being within 3-6 feet of the speaker will enhance word recognition. B) Avoid having the speaker back-lit as this will minimize the ability to use cues from lip-reading, facial expression and gestures. C)  Word recognition is poorer in background noise. For optimal word recognition, turn off the TV, radio or noisy fan when engaging in conversation. In a restaurant, try to sit away from noise sources and close to the primary speaker.  D)  Ask for topic clarification from time to time in order to remain in the conversation.  Most people don't mind repeating or clarifying a point when asked.  If needed, explain the difficulty hearing in background noise or hearing loss. 5.   Use hearing protection during noisy activities such as using a weed eater, moving the lawn, shooting, etc.    Musician's plugs, are available from [employer] for music related hearing protection because there is no distortion.  Other hearing protection, such as sponge plugs (available at pharmacies) or earmuffs (available at sporting goods stores or department stores such as [employer]) are useful for noisy activities and venues. 6.   Amplification helps make the signal louder and therefore often improves hearing and word recognition.  Amplification has many forms including hearing aids in one or both ears, an assistive listening device which have a microphone and speaker such  as a small handheld device and/or even a surround sound system of speakers.  Amplification may be covered by some insurances, but not all.  It is important to note that hearing aids must be individually fit according to the hearing test results and the ear shape.  Audiologists and hearing aid dealers in [place]  must be licensed in order to dispense hearing aids.  In addition, a trial period is mandated by law in our state because often amplification must be tried and then evaluated in order to determine benefit.  There are  many excellent choices when it comes to amplification in our area and providers are listed in the phone book under hearing aids, there are audiologists in private practice, those affiliated with Ear, Nose and Throat physicians, and there are audiologists located at [facility].  Important- if a referral is recommended, it is important that you follow-up to ensure the referral is made. Please call the physician's office to confirm that you want the referral and to find out where the referral will be made.   She is doing fine on Adderall. She has no insomnia, palptiations, headaches. She does not always take the full 2nd dose. She does admit that she feels overwhelmed and irritable a lot. She has frequent mood swings that were present before adderall. She can go through really "dark" places but then she has times where she feels really good. She has a long family hx of mental illness. Daughter recently dx with biploar.   She is on belviq and she does think helping with appetitie. She is down another 10lbs since 3 months ago. She is not exercising. She is eating a lot less.   Review of Systems See HPI.     Objective:   Physical Exam  Constitutional: She is oriented to person, place, and time. She appears well-developed and well-nourished.  HENT:  Head: Normocephalic and atraumatic.  Cardiovascular: Normal rate, regular rhythm and normal heart sounds.   Pulmonary/Chest: Effort normal and breath sounds normal.  Neurological: She is alert and oriented to person, place, and time.  Psychiatric: She has a normal mood and affect. Her behavior is normal.          Assessment & Plan:  [name].[name].Diagnoses and all orders for this visit:  Bilateral sensorineural hearing loss -     Ambulatory referral to ENT  Attention deficit hyperactivity disorder (ADHD), predominantly inattentive type -     Discontinue: amphetamine-dextroamphetamine (ADDERALL) 30 MG tablet; Take 1 tablet by mouth 2 (two) times daily. -      Discontinue: amphetamine-dextroamphetamine (ADDERALL) 30 MG tablet; Take 1 tablet by mouth 2 (two) times daily. Do not refill until 01/20/17 -     amphetamine-dextroamphetamine (ADDERALL) 30 MG tablet; Take 1 tablet by mouth 2 (two) times daily. Do not refill until 02/19/17  Class 1 obesity due to excess calories without serious comorbidity with body mass index (BMI) of 33.0 to 33.9 in adult -     Lorcaserin HCl 10 MG TABS; Take 1 tablet by mouth 2 (two) times daily.  Mood swings (HCC) -     Ambulatory referral to Psychiatry  Moderate episode of recurrent major depressive disorder (HCC) -     buPROPion (WELLBUTRIN XL) 300 MG 24 hr tablet; Take 1 tablet (300 mg total) by mouth daily. -     Ambulatory referral to Psychiatry   More than willing to make referral to get hearing aids. Pt is looking around at options. Audiologist suggest ENT evaluation will  make referral.  adderall refilled. Follow up in 3 months.  Continue belviq. Add exercise.  Refilled wellbutrin.  I do think pt should be evaluated or bipolar. Daughter recently diagnosed. Long hx of mental illness in the family

## 2016-12-22 NOTE — Patient Instructions (Signed)
Will make ENT and pysch referral.

## 2016-12-24 DIAGNOSIS — H903 Sensorineural hearing loss, bilateral: Secondary | ICD-10-CM | POA: Insufficient documentation

## 2017-01-01 ENCOUNTER — Telehealth: Payer: Self-pay | Admitting: *Deleted

## 2017-01-01 ENCOUNTER — Other Ambulatory Visit: Payer: Self-pay | Admitting: Physician Assistant

## 2017-01-01 DIAGNOSIS — F331 Major depressive disorder, recurrent, moderate: Secondary | ICD-10-CM

## 2017-01-01 DIAGNOSIS — F32A Depression, unspecified: Secondary | ICD-10-CM

## 2017-01-01 DIAGNOSIS — F329 Major depressive disorder, single episode, unspecified: Secondary | ICD-10-CM

## 2017-01-01 DIAGNOSIS — R4586 Emotional lability: Secondary | ICD-10-CM

## 2017-01-01 DIAGNOSIS — F410 Panic disorder [episodic paroxysmal anxiety] without agoraphobia: Secondary | ICD-10-CM

## 2017-01-01 DIAGNOSIS — F419 Anxiety disorder, unspecified: Secondary | ICD-10-CM

## 2017-01-01 NOTE — Telephone Encounter (Signed)
New referral placed per pt request  

## 2017-02-26 MED FILL — AMPHETAMINE SALTS 30 MG TAB: 30 | 30 days supply | Qty: 60 | Fill #0

## 2017-03-15 ENCOUNTER — Ambulatory Visit: Payer: BC Managed Care – PPO | Admitting: Physician Assistant

## 2017-03-22 ENCOUNTER — Ambulatory Visit: Payer: BC Managed Care – PPO | Admitting: Physician Assistant

## 2017-03-23 ENCOUNTER — Ambulatory Visit (HOSPITAL_COMMUNITY): Payer: BC Managed Care – PPO | Admitting: Psychiatry

## 2017-04-02 ENCOUNTER — Encounter: Payer: Self-pay | Admitting: Physician Assistant

## 2017-04-02 ENCOUNTER — Ambulatory Visit (INDEPENDENT_AMBULATORY_CARE_PROVIDER_SITE_OTHER): Payer: BC Managed Care – PPO | Admitting: Physician Assistant

## 2017-04-02 VITALS — BP 112/75 | HR 76 | Wt 138.0 lb

## 2017-04-02 DIAGNOSIS — F331 Major depressive disorder, recurrent, moderate: Secondary | ICD-10-CM | POA: Diagnosis not present

## 2017-04-02 DIAGNOSIS — E6609 Other obesity due to excess calories: Secondary | ICD-10-CM

## 2017-04-02 DIAGNOSIS — F900 Attention-deficit hyperactivity disorder, predominantly inattentive type: Secondary | ICD-10-CM | POA: Diagnosis not present

## 2017-04-02 DIAGNOSIS — Z6833 Body mass index (BMI) 33.0-33.9, adult: Secondary | ICD-10-CM

## 2017-04-02 MED ORDER — AMPHETAMINE-DEXTROAMPHETAMINE 30 MG PO TABS: 30.0000 mg | ORAL_TABLET | Freq: Two times a day (BID) | ORAL | 0 refills | Status: DC

## 2017-04-02 MED ORDER — VILAZODONE HCL 10 MG PO TABS: 10.0000 mg | ORAL_TABLET | Freq: Every day | ORAL | 2 refills | Status: DC

## 2017-04-02 MED ORDER — BUPROPION HCL ER (XL) 300 MG PO TB24: 300.0000 mg | ORAL_TABLET | Freq: Every day | ORAL | 2 refills | Status: DC

## 2017-04-02 MED ORDER — LORCASERIN HCL 10 MG PO TABS: 1.0000 | ORAL_TABLET | Freq: Two times a day (BID) | ORAL | 2 refills | Status: DC

## 2017-04-02 NOTE — Progress Notes (Signed)
Subjective:    Patient ID: Diana Conrad, female    DOB: 1965-10-13, 51 y.o.   MRN: [id number]  HPI  Pt is a 51 yo female who presents to the clinic for medication refills.   Weight- she continues to do well. She has lost another 6lbs on belviq.down to 138. She is staying active and trying to walk. No side effects.   ADHD- school has started back and doing ok the first week. No side effects to medication. She definitaely noticed improvement.   Her mood is not good. She feels down most of the time. She has had depression on and off most of her life. In the past she has tried paxil, prozax, zoloft. She feels like they helped but she just stopped them for one reason or another. She does not want to gain weight. She is on wellbutrin and does help some. No suicidal thoughts.   .. Active Ambulatory Problems    Diagnosis Date Noted  . Depression   . Alcohol abuse   . Anxiety   . Panic attacks   . Current occasional smoker   . Class 1 obesity due to excess calories without serious comorbidity with body mass index (BMI) of 33.0 to 33.9 in adult 07/03/2016  . Attention deficit hyperactivity disorder (ADHD), predominantly inattentive type 07/03/2016  . Hearing impaired person, bilateral 09/29/2016  . Bilateral sensorineural hearing loss 12/24/2016   Resolved Ambulatory Problems    Diagnosis Date Noted  . No Resolved Ambulatory Problems   Past Medical History:  Diagnosis Date  . Alcohol abuse   . Anxiety   . Current occasional smoker   . Depression   . Hyperlipidemia   . Panic attacks   . Stomach ulcer      Review of Systems  All other systems reviewed and are negative.      Objective:   Physical Exam  Constitutional: She is oriented to person, place, and time. She appears well-developed and well-nourished.  HENT:  Head: Normocephalic and atraumatic.  Cardiovascular: Normal rate, regular rhythm and normal heart sounds.   Pulmonary/Chest: Effort normal and breath sounds  normal.  Neurological: She is alert and oriented to person, place, and time.  Skin: Skin is dry.  Psychiatric: She has a normal mood and affect. Her behavior is normal.          Assessment & Plan:  Marland KitchenMarland KitchenLarosa was seen today for f/u meds.  Diagnoses and all orders for this visit:  Class 1 obesity due to excess calories without serious comorbidity with body mass index (BMI) of 33.0 to 33.9 in adult -     Lorcaserin HCl 10 MG TABS; Take 1 tablet by mouth 2 (two) times daily.  Moderate episode of recurrent major depressive disorder (HCC) -     buPROPion (WELLBUTRIN XL) 300 MG 24 hr tablet; Take 1 tablet (300 mg total) by mouth daily. -     Vilazodone HCl (VIIBRYD) 10 MG TABS; Take 1 tablet (10 mg total) by mouth daily.  Attention deficit hyperactivity disorder (ADHD), predominantly inattentive type -     Discontinue: amphetamine-dextroamphetamine (ADDERALL) 30 MG tablet; Take 1 tablet by mouth 2 (two) times daily. -     Discontinue: amphetamine-dextroamphetamine (ADDERALL) 30 MG tablet; Take 1 tablet by mouth 2 (two) times daily. Do not refill until 05/02/17. -     amphetamine-dextroamphetamine (ADDERALL) 30 MG tablet; Take 1 tablet by mouth 2 (two) times daily. Do not refill until 05/31/17.  .. Depression screen [facility] 2/9 04/02/2017  06/12/2016  Decreased Interest 2 3  Down, Depressed, Hopeless 2 3  PHQ - 2 Score 4 6  Altered sleeping 3 3  Tired, decreased energy 3 3  Change in appetite 2 3  Feeling bad or failure about yourself  1 3  Trouble concentrating 3 3  Moving slowly or fidgety/restless 3 0  Suicidal thoughts 0 1  PHQ-9 Score 19 22  Difficult doing work/chores - Very difficult   Started viibryd. Coupon card given. Discussed side effects.  Follow up in 2 months.   Refilled belviq.continue with healthy diet and regular exercise.   ADHD medication refilled for 3 months.

## 2017-04-13 ENCOUNTER — Telehealth: Payer: Self-pay | Admitting: Physician Assistant

## 2017-04-13 NOTE — Telephone Encounter (Signed)
Pt calls and does not like the side effects she read about viibryd. She would like to try trintellix. Pt has some left over from her daughter taking it. Ok to start  and cut wellbutrin in halfe. Follow up in 4 weeks.

## 2017-04-18 ENCOUNTER — Encounter: Payer: Self-pay | Admitting: Physician Assistant

## 2017-05-12 ENCOUNTER — Telehealth: Payer: Self-pay | Admitting: *Deleted

## 2017-05-12 NOTE — Telephone Encounter (Signed)
Pre Authorization sent to cover my meds. [employer]

## 2017-05-14 NOTE — Telephone Encounter (Signed)
Belviq has been approved through insurance. Pharm notified and patient notified via vm.

## 2017-05-17 ENCOUNTER — Ambulatory Visit (HOSPITAL_COMMUNITY): Payer: Self-pay | Admitting: Psychiatry

## 2017-06-09 ENCOUNTER — Other Ambulatory Visit: Payer: Self-pay | Admitting: Physician Assistant

## 2017-06-09 DIAGNOSIS — F331 Major depressive disorder, recurrent, moderate: Secondary | ICD-10-CM

## 2017-06-29 ENCOUNTER — Ambulatory Visit: Payer: BC Managed Care – PPO | Admitting: Physician Assistant

## 2017-06-29 ENCOUNTER — Encounter: Payer: Self-pay | Admitting: Physician Assistant

## 2017-06-29 VITALS — BP 114/76 | HR 94 | Wt 138.0 lb

## 2017-06-29 DIAGNOSIS — E6609 Other obesity due to excess calories: Secondary | ICD-10-CM

## 2017-06-29 DIAGNOSIS — F900 Attention-deficit hyperactivity disorder, predominantly inattentive type: Secondary | ICD-10-CM | POA: Diagnosis not present

## 2017-06-29 DIAGNOSIS — F331 Major depressive disorder, recurrent, moderate: Secondary | ICD-10-CM

## 2017-06-29 DIAGNOSIS — R4586 Emotional lability: Secondary | ICD-10-CM | POA: Diagnosis not present

## 2017-06-29 DIAGNOSIS — Z6833 Body mass index (BMI) 33.0-33.9, adult: Secondary | ICD-10-CM

## 2017-06-29 MED ORDER — AMPHETAMINE-DEXTROAMPHETAMINE 30 MG PO TABS: 30.0000 mg | ORAL_TABLET | Freq: Two times a day (BID) | ORAL | 0 refills | Status: DC

## 2017-06-29 MED ORDER — LAMOTRIGINE 25 MG PO TABS: ORAL_TABLET | ORAL | 2 refills | Status: DC

## 2017-06-29 MED ORDER — LORCASERIN HCL 10 MG PO TABS: 1.0000 | ORAL_TABLET | Freq: Two times a day (BID) | ORAL | 0 refills | Status: DC

## 2017-06-29 MED ORDER — BUPROPION HCL ER (XL) 300 MG PO TB24: 300.0000 mg | ORAL_TABLET | Freq: Every day | ORAL | 1 refills | Status: DC

## 2017-06-29 NOTE — Progress Notes (Signed)
Subjective:    Patient ID: Diana Conrad, female    DOB: 04/01/1966, 51 y.o.   MRN: [id number]  HPI  Pt is a 51 yo pleasant female who presents to the clinic for 3 month follow up.   ADD- she is doing great on adderall. No problems or concerns. It is helping a lot with finishing task. She denies any problems with sleep or palpitations.   Depression/mood changes- she has still not been able to see psychologist for opinion on mental health dx. She was doing great as far as depression on trintellix but she was eating a lot more so she stopped it. Now her depression and mood is worse but she has started to lose weight again. She remains on wellbutrin with modest benefit.   She remains on belviq for weight. She admits she is not doing all she should with weight and diet. She denies any side effects.   .. Active Ambulatory Problems    Diagnosis Date Noted  . Depression   . Alcohol abuse   . Anxiety   . Panic attacks   . Current occasional smoker   . Class 1 obesity due to excess calories without serious comorbidity with body mass index (BMI) of 33.0 to 33.9 in adult 07/03/2016  . Attention deficit hyperactivity disorder (ADHD), predominantly inattentive type 07/03/2016  . Hearing impaired person, bilateral 09/29/2016  . Bilateral sensorineural hearing loss 12/24/2016  . Mood changes 06/29/2017   Resolved Ambulatory Problems    Diagnosis Date Noted  . No Resolved Ambulatory Problems   Past Medical History:  Diagnosis Date  . Alcohol abuse   . Anxiety   . Current occasional smoker   . Depression   . Hyperlipidemia   . Panic attacks   . Stomach ulcer       Review of Systems  All other systems reviewed and are negative.      Objective:   Physical Exam  Constitutional: She is oriented to person, place, and time. She appears well-developed and well-nourished.  HENT:  Head: Normocephalic and atraumatic.  Cardiovascular: Normal rate, regular rhythm and normal heart  sounds.  Pulmonary/Chest: Effort normal and breath sounds normal.  Neurological: She is alert and oriented to person, place, and time.  Psychiatric: She has a normal mood and affect. Her behavior is normal.          Assessment & Plan:  [name].[name].Diagnoses and all orders for this visit:  Attention deficit hyperactivity disorder (ADHD), predominantly inattentive type -     amphetamine-dextroamphetamine (ADDERALL) 30 MG tablet; Take 1 tablet by mouth 2 (two) times daily.  Moderate episode of recurrent major depressive disorder (HCC) -     buPROPion (WELLBUTRIN XL) 300 MG 24 hr tablet; Take 1 tablet (300 mg total) by mouth daily. -     lamoTRIgine (LAMICTAL) 25 MG tablet; Take one tablet daily  for one week then increase to two tablets daily for one week. -     Ambulatory referral to Psychology  Class 1 obesity due to excess calories without serious comorbidity with body mass index (BMI) of 33.0 to 33.9 in adult -     Lorcaserin HCl 10 MG TABS; Take 1 tablet by mouth 2 (two) times daily.  Mood changes -     lamoTRIgine (LAMICTAL) 25 MG tablet; Take one tablet daily  for one week then increase to two tablets daily for one week. -     Ambulatory referral to Psychology   Adderall refilled for 3 months.  Pt is on belviq. Weight stayed the same but was over the thanksgiving holiday. Continue on belviq. Continue to work on diet and exercise.   Continue on wellbutrin. Stopped trintellix due to increased appetitie. Started lamictal.discussed side effects.  I have always suspected some bipolar tendencies but never placed a diagnoses. She has a long family hx of bipolar.  She was scheduled twice and then they called and canceled. She would like us to call and see if can get her in sooner. I placed referral.

## 2017-06-30 ENCOUNTER — Encounter: Payer: Self-pay | Admitting: Physician Assistant

## 2017-08-12 ENCOUNTER — Ambulatory Visit (INDEPENDENT_AMBULATORY_CARE_PROVIDER_SITE_OTHER): Payer: BC Managed Care – PPO | Admitting: Licensed Clinical Social Worker

## 2017-08-12 DIAGNOSIS — F3341 Major depressive disorder, recurrent, in partial remission: Secondary | ICD-10-CM

## 2017-08-26 ENCOUNTER — Ambulatory Visit (INDEPENDENT_AMBULATORY_CARE_PROVIDER_SITE_OTHER): Payer: BC Managed Care – PPO | Admitting: Licensed Clinical Social Worker

## 2017-08-26 DIAGNOSIS — F3132 Bipolar disorder, current episode depressed, moderate: Secondary | ICD-10-CM | POA: Diagnosis not present

## 2017-09-01 ENCOUNTER — Other Ambulatory Visit: Payer: Self-pay | Admitting: *Deleted

## 2017-09-01 DIAGNOSIS — F331 Major depressive disorder, recurrent, moderate: Secondary | ICD-10-CM

## 2017-09-01 DIAGNOSIS — R4586 Emotional lability: Secondary | ICD-10-CM

## 2017-09-01 MED ORDER — LAMOTRIGINE 25 MG PO TABS: 50.0000 mg | ORAL_TABLET | Freq: Every day | ORAL | 0 refills | Status: DC

## 2017-09-06 ENCOUNTER — Ambulatory Visit (INDEPENDENT_AMBULATORY_CARE_PROVIDER_SITE_OTHER): Payer: BC Managed Care – PPO | Admitting: Licensed Clinical Social Worker

## 2017-09-06 DIAGNOSIS — F3132 Bipolar disorder, current episode depressed, moderate: Secondary | ICD-10-CM

## 2017-09-20 ENCOUNTER — Ambulatory Visit (INDEPENDENT_AMBULATORY_CARE_PROVIDER_SITE_OTHER): Payer: BC Managed Care – PPO | Admitting: Licensed Clinical Social Worker

## 2017-09-20 DIAGNOSIS — F3132 Bipolar disorder, current episode depressed, moderate: Secondary | ICD-10-CM | POA: Diagnosis not present

## 2017-09-29 ENCOUNTER — Encounter: Payer: Self-pay | Admitting: Physician Assistant

## 2017-09-29 ENCOUNTER — Ambulatory Visit (INDEPENDENT_AMBULATORY_CARE_PROVIDER_SITE_OTHER): Payer: BC Managed Care – PPO | Admitting: Physician Assistant

## 2017-09-29 VITALS — BP 116/74 | HR 82 | Ht 59.0 in | Wt 131.0 lb

## 2017-09-29 DIAGNOSIS — F331 Major depressive disorder, recurrent, moderate: Secondary | ICD-10-CM | POA: Diagnosis not present

## 2017-09-29 DIAGNOSIS — Z6833 Body mass index (BMI) 33.0-33.9, adult: Secondary | ICD-10-CM

## 2017-09-29 DIAGNOSIS — E6609 Other obesity due to excess calories: Secondary | ICD-10-CM | POA: Diagnosis not present

## 2017-09-29 DIAGNOSIS — Z818 Family history of other mental and behavioral disorders: Secondary | ICD-10-CM

## 2017-09-29 DIAGNOSIS — R4586 Emotional lability: Secondary | ICD-10-CM

## 2017-09-29 DIAGNOSIS — F900 Attention-deficit hyperactivity disorder, predominantly inattentive type: Secondary | ICD-10-CM | POA: Diagnosis not present

## 2017-09-29 MED ORDER — AMPHETAMINE-DEXTROAMPHETAMINE 30 MG PO TABS: 30.0000 mg | ORAL_TABLET | Freq: Two times a day (BID) | ORAL | 0 refills | Status: DC

## 2017-09-29 MED ORDER — LORCASERIN HCL 10 MG PO TABS: 1.0000 | ORAL_TABLET | Freq: Two times a day (BID) | ORAL | 2 refills | Status: DC

## 2017-09-29 MED ORDER — LAMOTRIGINE 100 MG PO TABS: 100.0000 mg | ORAL_TABLET | Freq: Every day | ORAL | 2 refills | Status: DC

## 2017-09-29 NOTE — Progress Notes (Signed)
Subjective:    Patient ID: Diana Conrad, female    DOB: 09/20/1965, 52 y.o.   MRN: [id number]  HPI  Patient is a 52 year old female she presents to the clinic to follow-up and get medication refills.  She is here for her 4730-month follow-up on Adderall for ADHD.  She reports did be doing well on this dose.  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  She denies any increased insomnia, anxiety, palpitations.  She continues to lose weight on both feet.  She is down another 7 pounds from 3 months ago.  She continues to make portion control her main way to lose weight.  She is trying to walk occasionally.  Patient comes in having a very down day.  She did not go to work today.  When asked about her mood she feels like she has been down more lately.  Her mood can switch all of a sudden and then got very happy.  She is going to counseling every 2 weeks in [place].  She feels like it does help some.  She is ready to feel better overall.  She denies any suicidal or homicidal thoughts.  She is taking Lamictal and Wellbutrin.  .. Active Ambulatory Problems    Diagnosis Date Noted  . Depression   . Alcohol abuse   . Anxiety   . Panic attacks   . Current occasional smoker   . Class 1 obesity due to excess calories without serious comorbidity with body mass index (BMI) of 33.0 to 33.9 in adult 07/03/2016  . Attention deficit hyperactivity disorder (ADHD), predominantly inattentive type 07/03/2016  . Hearing impaired person, bilateral 09/29/2016  . Bilateral sensorineural hearing loss 12/24/2016  . Mood changes 06/29/2017   Resolved Ambulatory Problems    Diagnosis Date Noted  . No Resolved Ambulatory Problems   Past Medical History:  Diagnosis Date  . Alcohol abuse   . Anxiety   . Current occasional smoker   . Depression   . Hyperlipidemia   . Panic attacks   . Stomach ulcer        Review of Systems  All other systems  reviewed and are negative.      Objective:   Physical Exam  Constitutional: She is oriented to person, place, and time. She appears well-developed and well-nourished.  HENT:  Head: Normocephalic and atraumatic.  Eyes: Conjunctivae are normal.  Neck: Normal range of motion. Neck supple.  Cardiovascular: Normal rate, regular rhythm and normal heart sounds.  Pulmonary/Chest: Effort normal and breath sounds normal.  Neurological: She is alert and oriented to person, place, and time.  Psychiatric: Her behavior is normal.  Flat affect.           Assessment & Plan:  [name].[name].Delaney Meigsamara was seen today for depression and adhd.  Diagnoses and all orders for this visit:  Attention deficit hyperactivity disorder (ADHD), predominantly inattentive type -     amphetamine-dextroamphetamine (ADDERALL) 30 MG tablet; Take 1 tablet by mouth 2 (two) times daily.  Mood changes -     lamoTRIgine (LAMICTAL) 100 MG tablet; Take 1 tablet (100 mg total) by mouth daily. -     Ambulatory referral to Psychiatry  Class 1 obesity due to excess calories without serious comorbidity with body mass index (BMI) of 33.0 to 33.9 in adult -     Lorcaserin HCl 10 MG TABS; Take 1 tablet by mouth 2 (two) times daily.  Moderate episode of recurrent  major depressive disorder (HCC) -     lamoTRIgine (LAMICTAL) 100 MG tablet; Take 1 tablet (100 mg total) by mouth daily. -     Ambulatory referral to Psychiatry  Family history of bipolar disorder -     Ambulatory referral to Psychiatry   Overall adderall is doing well. BP good today. Refilled for 3 months.   Weight continues to drop. Pt is staying active when she feels good. Continue on belviq.   Increased lamictal for mood to 100mg . Continue with counseling. Referral made to psychiatry. I strongly suspect Bipolar disorder but would like evaluation to confirm. She is very hesitant to try any medication that could make her gain weight.   [name].Spent 30 minutes with patient and  greater than 50 percent of visit spent counseling patient regarding treatment plan.

## 2017-10-07 ENCOUNTER — Other Ambulatory Visit: Payer: Self-pay | Admitting: *Deleted

## 2017-10-13 ENCOUNTER — Ambulatory Visit: Payer: Self-pay | Admitting: Licensed Clinical Social Worker

## 2017-10-14 ENCOUNTER — Telehealth: Payer: Self-pay | Admitting: *Deleted

## 2017-10-14 NOTE — Telephone Encounter (Signed)
Pt's daughter called in today stating that her mom does not like the therapist she is seeing right now.  Daughter [facility] [address] and thinks her mom would like him as well.  She is going to need a referral.

## 2017-10-15 ENCOUNTER — Other Ambulatory Visit: Payer: Self-pay | Admitting: Physician Assistant

## 2017-10-15 DIAGNOSIS — F1010 Alcohol abuse, uncomplicated: Secondary | ICD-10-CM

## 2017-10-15 DIAGNOSIS — F329 Major depressive disorder, single episode, unspecified: Secondary | ICD-10-CM

## 2017-10-15 DIAGNOSIS — F32A Depression, unspecified: Secondary | ICD-10-CM

## 2017-10-15 DIAGNOSIS — R4586 Emotional lability: Secondary | ICD-10-CM

## 2017-10-15 DIAGNOSIS — F419 Anxiety disorder, unspecified: Secondary | ICD-10-CM

## 2017-10-15 DIAGNOSIS — F410 Panic disorder [episodic paroxysmal anxiety] without agoraphobia: Secondary | ICD-10-CM

## 2017-10-15 NOTE — Telephone Encounter (Signed)
Referral placed. I wanted you to see this too.

## 2017-10-28 ENCOUNTER — Ambulatory Visit: Payer: BC Managed Care – PPO | Admitting: Licensed Clinical Social Worker

## 2017-12-03 ENCOUNTER — Other Ambulatory Visit: Payer: Self-pay | Admitting: *Deleted

## 2017-12-03 DIAGNOSIS — F900 Attention-deficit hyperactivity disorder, predominantly inattentive type: Secondary | ICD-10-CM

## 2017-12-03 MED ORDER — AMPHETAMINE-DEXTROAMPHETAMINE 30 MG PO TABS: 30.0000 mg | ORAL_TABLET | Freq: Two times a day (BID) | ORAL | 0 refills | Status: DC

## 2017-12-03 MED FILL — AMPHETAMINE SALTS 30 MG TAB: 30 | 30 days supply | Qty: 60 | Fill #0

## 2017-12-03 NOTE — Telephone Encounter (Signed)
Pt called stating that Adderall is on backorder at her usual pharmacy & she wanted to know if you could send her last refill into the [facility].

## 2017-12-21 ENCOUNTER — Ambulatory Visit: Payer: Self-pay | Admitting: Physician Assistant

## 2017-12-23 ENCOUNTER — Other Ambulatory Visit: Payer: Self-pay | Admitting: Physician Assistant

## 2017-12-23 DIAGNOSIS — R4586 Emotional lability: Secondary | ICD-10-CM

## 2017-12-23 DIAGNOSIS — F331 Major depressive disorder, recurrent, moderate: Secondary | ICD-10-CM

## 2017-12-27 ENCOUNTER — Other Ambulatory Visit: Payer: Self-pay | Admitting: Physician Assistant

## 2017-12-27 DIAGNOSIS — F331 Major depressive disorder, recurrent, moderate: Secondary | ICD-10-CM

## 2017-12-29 ENCOUNTER — Ambulatory Visit: Payer: Self-pay | Admitting: Physician Assistant

## 2017-12-31 ENCOUNTER — Ambulatory Visit (INDEPENDENT_AMBULATORY_CARE_PROVIDER_SITE_OTHER): Payer: BC Managed Care – PPO | Admitting: Physician Assistant

## 2017-12-31 ENCOUNTER — Encounter: Payer: Self-pay | Admitting: Physician Assistant

## 2017-12-31 DIAGNOSIS — E6609 Other obesity due to excess calories: Secondary | ICD-10-CM

## 2017-12-31 DIAGNOSIS — Z6833 Body mass index (BMI) 33.0-33.9, adult: Secondary | ICD-10-CM | POA: Diagnosis not present

## 2017-12-31 DIAGNOSIS — R4586 Emotional lability: Secondary | ICD-10-CM | POA: Diagnosis not present

## 2017-12-31 DIAGNOSIS — F900 Attention-deficit hyperactivity disorder, predominantly inattentive type: Secondary | ICD-10-CM | POA: Diagnosis not present

## 2017-12-31 DIAGNOSIS — F331 Major depressive disorder, recurrent, moderate: Secondary | ICD-10-CM | POA: Diagnosis not present

## 2017-12-31 MED ORDER — AMPHETAMINE-DEXTROAMPHETAMINE 30 MG PO TABS: 30.0000 mg | ORAL_TABLET | Freq: Two times a day (BID) | ORAL | 0 refills | Status: DC

## 2017-12-31 MED ORDER — LORCASERIN HCL 10 MG PO TABS: 1.0000 | ORAL_TABLET | Freq: Two times a day (BID) | ORAL | 2 refills | Status: DC

## 2017-12-31 MED ORDER — BUPROPION HCL ER (XL) 300 MG PO TB24: 300.0000 mg | ORAL_TABLET | Freq: Every day | ORAL | 3 refills | Status: DC

## 2017-12-31 MED ORDER — LAMOTRIGINE 100 MG PO TABS: 100.0000 mg | ORAL_TABLET | Freq: Every day | ORAL | 3 refills | Status: DC

## 2017-12-31 MED FILL — BELVIQ 10 MG TABLET: 10 | 30 days supply | Qty: 60 | Fill #0

## 2017-12-31 MED FILL — buPROPion HCL ER (XL) 300 M: 300 | 90 days supply | Qty: 90 | Fill #0

## 2017-12-31 MED FILL — lamoTRIgine 100 MG TABS: 100 | 90 days supply | Qty: 90 | Fill #0

## 2017-12-31 MED FILL — AMPHETAMINE SALTS 30 MG TAB: 30 | 30 days supply | Qty: 60 | Fill #0

## 2017-12-31 NOTE — Progress Notes (Signed)
Subjective:    Patient ID: Diana Conrad, female    DOB: 05/26/1966, 52 y.o.   MRN: [id number]  HPI Patient is a 52 year old female who presents for her 2558-month follow-up on ADHD.  Patient is a Runner, broadcasting/film/videoteacher and finishing up the semester.  She is doing really well on her current dose.  She denies any changes in sleep patterns, anxiety, palpitation.  She has no concerns regarding this medication.  She continues to have mood changes, depression, anxiety.  They do seem to be fairly well controlled on Lamictal and Wellbutrin.  She still request to go to psychiatry for firm diagnosis.  She has tried calling them back but she cannot get them to answer.  Patient remains on Belviq and doing well.  She continues to lose weight but it seems to have tapered at this time.  She lost 1 pound in the last month.  Overall she has lost about 30 pounds since starting weight loss journey in 2017.   .. Active Ambulatory Problems    Diagnosis Date Noted  . Depression   . Alcohol abuse   . Anxiety   . Panic attacks   . Current occasional smoker   . Class 1 obesity due to excess calories without serious comorbidity with body mass index (BMI) of 33.0 to 33.9 in adult 07/03/2016  . Attention deficit hyperactivity disorder (ADHD), predominantly inattentive type 07/03/2016  . Hearing impaired person, bilateral 09/29/2016  . Bilateral sensorineural hearing loss 12/24/2016  . Mood changes 06/29/2017   Resolved Ambulatory Problems    Diagnosis Date Noted  . No Resolved Ambulatory Problems   Past Medical History:  Diagnosis Date  . Alcohol abuse   . Anxiety   . Current occasional smoker   . Depression   . Hyperlipidemia   . Panic attacks   . Stomach ulcer       Review of Systems  All other systems reviewed and are negative.      Objective:   Physical Exam  Constitutional: She is oriented to person, place, and time. She appears well-developed and well-nourished.  HENT:  Head: Normocephalic and  atraumatic.  Cardiovascular: Normal rate and regular rhythm.  Pulmonary/Chest: Effort normal and breath sounds normal.  Neurological: She is alert and oriented to person, place, and time.  Psychiatric: She has a normal mood and affect. Her behavior is normal.          Assessment & Plan:  [name].[name].Diagnoses and all orders for this visit:  Attention deficit hyperactivity disorder (ADHD), predominantly inattentive type -     amphetamine-dextroamphetamine (ADDERALL) 30 MG tablet; Take 1 tablet by mouth 2 (two) times daily.  Class 1 obesity due to excess calories without serious comorbidity with body mass index (BMI) of 33.0 to 33.9 in adult -     Lorcaserin HCl 10 MG TABS; Take 1 tablet by mouth 2 (two) times daily.  Moderate episode of recurrent major depressive disorder (HCC) -     buPROPion (WELLBUTRIN XL) 300 MG 24 hr tablet; Take 1 tablet (300 mg total) by mouth daily. -     lamoTRIgine (LAMICTAL) 100 MG tablet; Take 1 tablet (100 mg total) by mouth daily.  Mood changes -     lamoTRIgine (LAMICTAL) 100 MG tablet; Take 1 tablet (100 mg total) by mouth daily.  Other orders -     amphetamine-dextroamphetamine (ADDERALL) 30 MG tablet; Take 1 tablet by mouth 2 (two) times daily. -     amphetamine-dextroamphetamine (ADDERALL) 30 MG tablet; Take 1  tablet by mouth 2 (two) times daily.   Adderall refilled for 3 months.   Pt was never called back from psychiatry.  I reprinted referral and asked the referral coordinator to follow-up on this.  Belviq, Lamictal, Wellbutrin refilled.  Patient continues to do well with her weight.  Discussed weight management with diet and exercise.

## 2018-02-16 MED FILL — AMPHETAMINE SALTS 30 MG TAB: 30 | 30 days supply | Qty: 60 | Fill #0

## 2018-03-04 ENCOUNTER — Ambulatory Visit (HOSPITAL_COMMUNITY): Payer: BC Managed Care – PPO | Admitting: Psychiatry

## 2018-04-01 ENCOUNTER — Encounter: Payer: Self-pay | Admitting: Physician Assistant

## 2018-04-01 ENCOUNTER — Ambulatory Visit: Payer: BC Managed Care – PPO | Admitting: Physician Assistant

## 2018-04-01 VITALS — BP 131/81 | HR 95 | Ht 59.0 in | Wt 133.0 lb

## 2018-04-01 DIAGNOSIS — N3090 Cystitis, unspecified without hematuria: Secondary | ICD-10-CM

## 2018-04-01 DIAGNOSIS — Z6833 Body mass index (BMI) 33.0-33.9, adult: Secondary | ICD-10-CM

## 2018-04-01 DIAGNOSIS — R4586 Emotional lability: Secondary | ICD-10-CM | POA: Diagnosis not present

## 2018-04-01 DIAGNOSIS — Z23 Encounter for immunization: Secondary | ICD-10-CM

## 2018-04-01 DIAGNOSIS — F331 Major depressive disorder, recurrent, moderate: Secondary | ICD-10-CM

## 2018-04-01 DIAGNOSIS — R350 Frequency of micturition: Secondary | ICD-10-CM

## 2018-04-01 DIAGNOSIS — E6609 Other obesity due to excess calories: Secondary | ICD-10-CM | POA: Diagnosis not present

## 2018-04-01 DIAGNOSIS — F900 Attention-deficit hyperactivity disorder, predominantly inattentive type: Secondary | ICD-10-CM | POA: Diagnosis not present

## 2018-04-01 LAB — POCT URINALYSIS DIPSTICK
Bilirubin, UA: NEGATIVE
GLUCOSE UA: NEGATIVE
Ketones, UA: 15
Nitrite, UA: POSITIVE
PROTEIN UA: POSITIVE — AB
Spec Grav, UA: >=1.03 — AB (ref 1.010–1.025)
Urobilinogen, UA: 1 E.U./dL
pH, UA: 5.5 (ref 5.0–8.0)

## 2018-04-01 MED ORDER — LORCASERIN HCL 10 MG PO TABS: 1.0000 | ORAL_TABLET | Freq: Two times a day (BID) | ORAL | 2 refills | Status: DC

## 2018-04-01 MED ORDER — AMPHETAMINE-DEXTROAMPHETAMINE 30 MG PO TABS: 30.0000 mg | ORAL_TABLET | Freq: Two times a day (BID) | ORAL | 0 refills | Status: DC

## 2018-04-01 MED ORDER — NITROFURANTOIN MONOHYD MACRO 100 MG PO CAPS: 100.0000 mg | ORAL_CAPSULE | Freq: Two times a day (BID) | ORAL | 0 refills | Status: DC

## 2018-04-01 MED FILL — NITROFURANTOIN MONO-MCR 100: 100 | 7 days supply | Qty: 14 | Fill #0

## 2018-04-01 MED FILL — AMPHETAMINE SALTS 30 MG TAB: 30 | 30 days supply | Qty: 60 | Fill #0

## 2018-04-01 MED FILL — BELVIQ 10 MG TABLET: 10 | 30 days supply | Qty: 60 | Fill #0

## 2018-04-01 NOTE — Patient Instructions (Signed)

## 2018-04-01 NOTE — Progress Notes (Signed)
Subjective:    Patient ID: Diana Conrad, female    DOB: 10/15/1965, 52 y.o.   MRN: [id number]  HPI  Pt is a 52 yo female with ADHD, mood changes, anxiety, depression who presents to the clinic for 3 month ADHD follow up.   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 She feels not respected or supported. She does feel like adderall helps a lot to keep her work done but she does not like the way she feels emotionally. She is very emotional. She is up and down a lot. She is having a lot of problems sleeping. She has a strong hx of bipolar.  Her appt with pysch has been canceled twice. She is very frustrated. The medications we have tried have helped some but not the benefit she desires. No SI/HC.   She has also had some increase in urinary frequency, lower abdominal pressure for the last 4 days. No fever, chills, nausea, flank pain. She does feel like she has UTI. She has not taken anything to make better.   .. Active Ambulatory Problems    Diagnosis Date Noted  . Depression   . Alcohol abuse   . Anxiety   . Panic attacks   . Current occasional smoker   . Class 1 obesity due to excess calories without serious comorbidity with body mass index (BMI) of 33.0 to 33.9 in adult 07/03/2016  . Attention deficit hyperactivity disorder (ADHD), predominantly inattentive type 07/03/2016  . Hearing impaired person, bilateral 09/29/2016  . Bilateral sensorineural hearing loss 12/24/2016  . Mood changes 06/29/2017   Resolved Ambulatory Problems    Diagnosis Date Noted  . No Resolved Ambulatory Problems   Past Medical History:  Diagnosis Date  . Hyperlipidemia   . Stomach ulcer         Review of Systems  All other systems reviewed and are negative.      Objective:   Physical Exam  Constitutional: She is oriented to person, place, and time. She appears well-developed and well-nourished.  HENT:  Head:  Normocephalic and atraumatic.  Cardiovascular: Normal rate and regular rhythm.  Pulmonary/Chest: Effort normal and breath sounds normal.  Abdominal: Soft. Bowel sounds are normal. She exhibits no distension and no mass. There is no tenderness. There is no guarding.  Neurological: She is alert and oriented to person, place, and time.  Psychiatric:  Depressed           Assessment & Plan:  [name].[name].Diagnoses and all orders for this visit:  Cystitis -     nitrofurantoin, macrocrystal-monohydrate, (MACROBID) 100 MG capsule; Take 1 capsule (100 mg total) by mouth 2 (two) times daily. For 7 days.  Attention deficit hyperactivity disorder (ADHD), predominantly inattentive type -     amphetamine-dextroamphetamine (ADDERALL) 30 MG tablet; Take 1 tablet by mouth 2 (two) times daily. -     amphetamine-dextroamphetamine (ADDERALL) 30 MG tablet; Take 1 tablet by mouth 2 (two) times daily. -     amphetamine-dextroamphetamine (ADDERALL) 30 MG tablet; Take 1 tablet by mouth 2 (two) times daily.  Class 1 obesity due to excess calories without serious comorbidity with body mass index (BMI) of 33.0 to 33.9 in adult -     Lorcaserin HCl 10 MG TABS; Take 1 tablet by mouth 2 (two) times daily.  Mood changes  Moderate episode of recurrent major depressive disorder (HCC)  Urinary frequency -     POCT  Urinalysis Dipstick  Need for immunization against influenza -     Flu Vaccine QUAD 36+ mos IM   .[name] Results for orders placed or performed in visit on 04/01/18  POCT Urinalysis Dipstick  Result Value Ref Range   Color, UA dark yellow    Clarity, UA     Glucose, UA Negative Negative   Bilirubin, UA negative    Ketones, UA 15    Spec Grav, UA >=1.030 (A) 1.010 - 1.025   Blood, UA large    pH, UA 5.5 5.0 - 8.0   Protein, UA Positive (A) Negative   Urobilinogen, UA 1.0 0.2 or 1.0 E.U./dL   Nitrite, UA positive    Leukocytes, UA Large (3+) (A) Negative   Appearance     Odor     UA positive for UTI.  Treated with macrobid. Symptomatic care discussed. Follow up as needed.   Refilled Adderall for 3 months.   Sent msg to see if we can get her in with psychiatrist for assessment of bipolar so her mood could be better treated.discussed with pt if not called soon with appt please let us know.    Weight is stable. Continue on belviq.

## 2018-04-03 ENCOUNTER — Encounter: Payer: Self-pay | Admitting: Physician Assistant

## 2018-04-03 ENCOUNTER — Telehealth: Payer: Self-pay | Admitting: Physician Assistant

## 2018-04-03 NOTE — Telephone Encounter (Signed)
Pt reports she has had lots of problems with psychology referral with [facility]. Her appt has been canceled twice and she struggles for anyone to answer. She needs a psychiatrist at this point. Can we get her in with who we got ebony, daughter to see? May need to look in chart.

## 2018-04-05 NOTE — Telephone Encounter (Signed)
Sent referral 

## 2018-04-20 ENCOUNTER — Encounter: Payer: Self-pay | Admitting: Physician Assistant

## 2018-05-02 ENCOUNTER — Telehealth: Payer: Self-pay | Admitting: Physician Assistant

## 2018-05-02 NOTE — Telephone Encounter (Signed)
Received fax from [facility] [employer] that Belviq has been approved from 05/02/2018 through 08/01/2018. Form sent to scan. Pharmacy aware.-hsm.

## 2018-05-02 NOTE — Telephone Encounter (Signed)
Can you look into why she hasn't heard from referral? Can we give her contact information to call them.

## 2018-05-02 NOTE — Telephone Encounter (Signed)
Received fax from [employer] that Belviq requires a PA. Information has been sent to the insurance company. Awaiting determination.   

## 2018-05-06 ENCOUNTER — Encounter: Payer: Self-pay | Admitting: Physician Assistant

## 2018-05-06 ENCOUNTER — Ambulatory Visit: Payer: BC Managed Care – PPO | Admitting: Physician Assistant

## 2018-05-06 VITALS — BP 118/78 | HR 80 | Wt 134.0 lb

## 2018-05-06 DIAGNOSIS — R4586 Emotional lability: Secondary | ICD-10-CM | POA: Diagnosis not present

## 2018-05-06 DIAGNOSIS — F329 Major depressive disorder, single episode, unspecified: Secondary | ICD-10-CM | POA: Diagnosis not present

## 2018-05-06 DIAGNOSIS — F900 Attention-deficit hyperactivity disorder, predominantly inattentive type: Secondary | ICD-10-CM | POA: Diagnosis not present

## 2018-05-06 DIAGNOSIS — F419 Anxiety disorder, unspecified: Secondary | ICD-10-CM | POA: Diagnosis not present

## 2018-05-06 DIAGNOSIS — F32A Depression, unspecified: Secondary | ICD-10-CM
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
## 2018-05-12 ENCOUNTER — Encounter: Payer: Self-pay | Admitting: Physician Assistant

## 2018-05-12 ENCOUNTER — Telehealth: Payer: Self-pay | Admitting: Physician Assistant

## 2018-05-12 NOTE — Telephone Encounter (Signed)
I never heard back from patient regarding days she could be written out. I am hold FMLA. I will be gone next week. Let me know if I can do anything.

## 2018-05-13 NOTE — Telephone Encounter (Signed)
Call placed to pt and no answer. Left pt VM advising her that we have FMLA paper work and if she wants this done, she MUST call me back today and provide SPECIFIC dates as to when she will be be out of work.   Advised pt that Lesly Rubenstein will be out of office for 1 week after today and if I do not hear back from her today, it will be another week before we are able to complete any paperwork.

## 2018-06-08 MED FILL — BELVIQ 10 MG TABLET: 10 | 30 days supply | Qty: 60 | Fill #1

## 2018-06-08 MED FILL — SUBVENITE 100 MG TABS: 100 | 90 days supply | Qty: 90 | Fill #1

## 2018-06-08 MED FILL — buPROPion HCL ER (XL) 300 M: 300 | 90 days supply | Qty: 90 | Fill #1

## 2018-06-08 MED FILL — AMPHETAMINE SALTS 30 MG TAB: 30 | 30 days supply | Qty: 60 | Fill #0

## 2018-07-04 ENCOUNTER — Ambulatory Visit: Payer: Self-pay | Admitting: Physician Assistant

## 2018-08-05 ENCOUNTER — Ambulatory Visit (INDEPENDENT_AMBULATORY_CARE_PROVIDER_SITE_OTHER): Payer: BC Managed Care – PPO | Admitting: Physician Assistant

## 2018-08-05 ENCOUNTER — Encounter: Payer: Self-pay | Admitting: Physician Assistant

## 2018-08-05 VITALS — BP 130/77 | HR 71 | Ht 59.0 in | Wt 139.7 lb

## 2018-08-05 DIAGNOSIS — Z6833 Body mass index (BMI) 33.0-33.9, adult: Secondary | ICD-10-CM

## 2018-08-05 DIAGNOSIS — E6609 Other obesity due to excess calories: Secondary | ICD-10-CM | POA: Diagnosis not present

## 2018-08-05 DIAGNOSIS — R4586 Emotional lability: Secondary | ICD-10-CM | POA: Diagnosis not present

## 2018-08-05 DIAGNOSIS — F331 Major depressive disorder, recurrent, moderate: Secondary | ICD-10-CM | POA: Diagnosis not present

## 2018-08-05 DIAGNOSIS — M79644 Pain in right finger(s): Secondary | ICD-10-CM

## 2018-08-05 DIAGNOSIS — G8929 Other chronic pain: Secondary | ICD-10-CM

## 2018-08-05 DIAGNOSIS — F900 Attention-deficit hyperactivity disorder, predominantly inattentive type: Secondary | ICD-10-CM

## 2018-08-05 DIAGNOSIS — Z1239 Encounter for other screening for malignant neoplasm of breast: Secondary | ICD-10-CM

## 2018-08-05 MED ORDER — LORCASERIN HCL 10 MG PO TABS: 1.0000 | ORAL_TABLET | Freq: Two times a day (BID) | ORAL | 2 refills | Status: DC

## 2018-08-05 MED ORDER — AMPHETAMINE-DEXTROAMPHETAMINE 30 MG PO TABS: 30.0000 mg | ORAL_TABLET | Freq: Two times a day (BID) | ORAL | 0 refills | Status: DC

## 2018-08-05 MED ORDER — DICLOFENAC SODIUM 1 % TD GEL: 4.0000 g | Freq: Four times a day (QID) | TRANSDERMAL | 1 refills | Status: DC

## 2018-08-05 MED ORDER — LAMOTRIGINE 100 MG PO TABS: 100.0000 mg | ORAL_TABLET | Freq: Every day | ORAL | 3 refills | Status: DC

## 2018-08-05 MED ORDER — BUPROPION HCL ER (XL) 300 MG PO TB24: 300.0000 mg | ORAL_TABLET | Freq: Every day | ORAL | 3 refills | Status: DC

## 2018-08-05 MED FILL — AMPHETAMINE SALTS 30 MG TAB: 30 | 30 days supply | Qty: 60 | Fill #0

## 2018-08-05 NOTE — Progress Notes (Signed)
Subjective:    Patient ID: Diana Conrad, female    DOB: 06/22/1966, 53 y.o.   MRN: [id number]  HPI Pt is a 53 yo female with ADHD, mood changes, MDD who presents to the clinic for medication refills.   Pt is still in a job that she does not like and causing a lot of stress and anxiety. She is looking for another job. She still have not heard from psychiatrist for her mood. Overall her focus is ok. No problems or concerns there. Denies any SI/HC.   Pt is having some right thumb pain that radiates into forearm. No trauma. 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 Been going on for a few weeks.   .. Active Ambulatory Problems    Diagnosis Date Noted  . Depression   . Alcohol abuse   . Anxiety   . Panic attacks   . Current occasional smoker   . Class 1 obesity due to excess calories without serious comorbidity with body mass index (BMI) of 33.0 to 33.9 in adult 07/03/2016  . Attention deficit hyperactivity disorder (ADHD), predominantly inattentive type 07/03/2016  . Hearing impaired person, bilateral 09/29/2016  . Bilateral sensorineural hearing loss 12/24/2016  . Mood changes 06/29/2017   Resolved Ambulatory Problems    Diagnosis Date Noted  . No Resolved Ambulatory Problems   Past Medical History:  Diagnosis Date  . Hyperlipidemia   . Stomach ulcer       Review of Systems See HPI.     Objective:   Physical Exam Vitals signs reviewed.  Constitutional:      Appearance: Normal appearance.  HENT:     Head: Normocephalic and atraumatic.  Cardiovascular:     Rate and Rhythm: Normal rate and regular rhythm.     Pulses: Normal pulses.     Heart sounds: Normal heart sounds.  Musculoskeletal:     Comments: Right thumb no pain to palpation over joints.  Positive finklestein.  No swelling and bruising.   Neurological:     General: No focal deficit present.     Mental Status: She is alert and oriented to person, place, and time.  Psychiatric:        Mood  and Affect: Mood normal.        Behavior: Behavior normal.           Assessment & Plan:  [name].[name].Diana Conrad was seen today for follow-up.  Diagnoses and all orders for this visit:  Attention deficit hyperactivity disorder (ADHD), predominantly inattentive type -     amphetamine-dextroamphetamine (ADDERALL) 30 MG tablet; Take 1 tablet by mouth 2 (two) times daily. -     amphetamine-dextroamphetamine (ADDERALL) 30 MG tablet; Take 1 tablet by mouth 2 (two) times daily. -     amphetamine-dextroamphetamine (ADDERALL) 30 MG tablet; Take 1 tablet by mouth 2 (two) times daily.  Moderate episode of recurrent major depressive disorder (HCC) -     buPROPion (WELLBUTRIN XL) 300 MG 24 hr tablet; Take 1 tablet (300 mg total) by mouth daily. -     lamoTRIgine (LAMICTAL) 100 MG tablet; Take 1 tablet (100 mg total) by mouth daily. -     Ambulatory referral to Psychiatry  Mood changes -     lamoTRIgine (LAMICTAL) 100 MG tablet; Take 1 tablet (100 mg total) by mouth daily. -     Ambulatory referral to Psychiatry  Class 1 obesity due to excess calories without serious comorbidity with body mass index (BMI) of 33.0 to  33.9 in adult -     Lorcaserin HCl 10 MG TABS; Take 1 tablet by mouth 2 (two) times daily.  Breast cancer screening -     MM 3D SCREEN BREAST BILATERAL  Chronic pain of right thumb -     diclofenac sodium (VOLTAREN) 1 % GEL; Apply 4 g topically 4 (four) times daily. To affected joint.     .. Depression screen [facility] 2/9 08/05/2018 04/01/2018 09/29/2017 04/02/2017 06/12/2016  Decreased Interest 3 3 2 2 3   Down, Depressed, Hopeless 2 2 2 2 3   PHQ - 2 Score 5 5 4 4 6   Altered sleeping 3 3 2 3 3   Tired, decreased energy 3 2 2 3 3   Change in appetite 3 2 2 2 3   Feeling bad or failure about yourself  3 2 1 1 3   Trouble concentrating 3 1 1 3 3   Moving slowly or fidgety/restless 2 0 1 3 0  Suicidal thoughts 1 0 0 0 1  PHQ-9 Score 23 15 13 19 22   Difficult doing work/chores Very difficult Very  difficult Somewhat difficult - Very difficult   .[name] GAD 7 : Generalized Anxiety Score 08/05/2018 04/01/2018 09/29/2017  Nervous, Anxious, on Edge 3 2 2   Control/stop worrying 3 3 2   Worry too much - different things 3 3 3   Trouble relaxing 3 3 3   Restless 0 2 1  Easily annoyed or irritable 3 3 3   Afraid - awful might happen 3 2 2   Total GAD 7 Score 18 18 16   Anxiety Difficulty Extremely difficult Very difficult Very difficult   Made another referral for psychiatry.   Refilled adderall for 3 months.   Seems like some tenosynovitis. voltaren gel given to rub over right thumb and up arm. Consider OTC thumb spica. Ice as needed with NSAIDs. Follow up as needed.

## 2018-08-15 ENCOUNTER — Telehealth: Payer: Self-pay | Admitting: Physician Assistant

## 2018-08-15 NOTE — Telephone Encounter (Signed)
Received fax from [facility] [employer] that Belviq was approved from 08/15/2018 through 08/14/2018 Pharmacy notified and form sent to scan. Patient is aware as well. Diclofenac Gel did not meet medical necessity.  Reference ID: [id number].

## 2018-08-17 ENCOUNTER — Ambulatory Visit (INDEPENDENT_AMBULATORY_CARE_PROVIDER_SITE_OTHER): Payer: BC Managed Care – PPO

## 2018-08-17 DIAGNOSIS — R928 Other abnormal and inconclusive findings on diagnostic imaging of breast: Secondary | ICD-10-CM

## 2018-08-18 NOTE — Progress Notes (Signed)
Call pt: just alert her that possible right breast mass that need further investigation. Should be contacted by imaging.

## 2018-08-19 ENCOUNTER — Other Ambulatory Visit: Payer: Self-pay | Admitting: Physician Assistant

## 2018-08-19 DIAGNOSIS — R928 Other abnormal and inconclusive findings on diagnostic imaging of breast: Secondary | ICD-10-CM

## 2018-08-25 ENCOUNTER — Encounter (HOSPITAL_COMMUNITY): Payer: Self-pay | Admitting: Psychiatry

## 2018-08-25 ENCOUNTER — Ambulatory Visit (INDEPENDENT_AMBULATORY_CARE_PROVIDER_SITE_OTHER): Payer: BC Managed Care – PPO | Admitting: Psychiatry

## 2018-08-25 VITALS — BP 120/88 | HR 74 | Ht 59.0 in | Wt 135.0 lb

## 2018-08-25 DIAGNOSIS — F0630 Mood disorder due to known physiological condition, unspecified: Secondary | ICD-10-CM | POA: Diagnosis not present

## 2018-08-25 DIAGNOSIS — F331 Major depressive disorder, recurrent, moderate: Secondary | ICD-10-CM

## 2018-08-25 DIAGNOSIS — F411 Generalized anxiety disorder: Secondary | ICD-10-CM

## 2018-08-25 DIAGNOSIS — R4586 Emotional lability: Secondary | ICD-10-CM

## 2018-08-25 MED ORDER — BUSPIRONE HCL 7.5 MG PO TABS: 30.0000 mg | ORAL_TABLET | Freq: Two times a day (BID) | ORAL | 2 refills | Status: DC

## 2018-08-25 MED ORDER — LAMOTRIGINE 150 MG PO TABS: 150.0000 mg | ORAL_TABLET | Freq: Every day | ORAL | 0 refills | Status: DC

## 2018-08-25 MED FILL — DICLOFENAC SODIUM 1 % GEL: 1 | 6 days supply | Qty: 100 | Fill #0

## 2018-08-25 MED FILL — busPIRone HCL 7.5 MG TABS: 7.5 | 3 days supply | Qty: 30 | Fill #0

## 2018-08-25 MED FILL — SUBVENITE 150 MG TABS: 150 | 30 days supply | Qty: 30 | Fill #0
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
## 2018-08-29 MED FILL — busPIRone HCL 7.5 MG TABS: 7.5 | 3 days supply | Qty: 30 | Fill #1

## 2018-09-07 ENCOUNTER — Ambulatory Visit
Admission: RE | Admit: 2018-09-07 | Discharge: 2018-09-07 | Disposition: A | Discharge status: Home / Self Care | Payer: BC Managed Care – PPO | Source: Ambulatory Visit | Attending: Physician Assistant | Admitting: Physician Assistant

## 2018-09-07 ENCOUNTER — Other Ambulatory Visit: Payer: Self-pay | Admitting: Physician Assistant

## 2018-09-07 ENCOUNTER — Encounter: Payer: Self-pay | Admitting: Physician Assistant

## 2018-09-07 DIAGNOSIS — N6001 Solitary cyst of right breast: Secondary | ICD-10-CM

## 2018-09-07 DIAGNOSIS — N6310 Unspecified lump in the right breast, unspecified quadrant: Secondary | ICD-10-CM | POA: Insufficient documentation

## 2018-09-07 DIAGNOSIS — R928 Other abnormal and inconclusive findings on diagnostic imaging of breast: Secondary | ICD-10-CM

## 2018-09-07 NOTE — Progress Notes (Signed)
Call pt: imaging suggested repeat u/s of right breast in 6 months due to right breast mass.

## 2018-09-22 ENCOUNTER — Other Ambulatory Visit: Payer: Self-pay

## 2018-09-22 ENCOUNTER — Encounter (HOSPITAL_COMMUNITY): Payer: Self-pay | Admitting: Psychiatry

## 2018-09-22 ENCOUNTER — Ambulatory Visit (INDEPENDENT_AMBULATORY_CARE_PROVIDER_SITE_OTHER): Payer: BC Managed Care – PPO | Admitting: Psychiatry

## 2018-09-22 VITALS — BP 124/80 | HR 87 | Ht 59.0 in | Wt 139.0 lb

## 2018-09-22 DIAGNOSIS — F331 Major depressive disorder, recurrent, moderate: Secondary | ICD-10-CM

## 2018-09-22 DIAGNOSIS — R4586 Emotional lability: Secondary | ICD-10-CM

## 2018-09-22 DIAGNOSIS — F0630 Mood disorder due to known physiological condition, unspecified: Secondary | ICD-10-CM | POA: Diagnosis not present

## 2018-09-22 DIAGNOSIS — F411 Generalized anxiety disorder: Secondary | ICD-10-CM | POA: Diagnosis not present

## 2018-09-22 MED ORDER — LAMOTRIGINE 25 MG PO TABS: 25.0000 mg | ORAL_TABLET | Freq: Every day | ORAL | 0 refills | Status: DC

## 2018-09-22 MED ORDER — BUSPIRONE HCL 7.5 MG PO TABS: 7.5000 mg | ORAL_TABLET | Freq: Two times a day (BID) | ORAL | 1 refills | Status: DC

## 2018-09-22 MED FILL — busPIRone HCL 7.5 MG TABS: 7.5 | 30 days supply | Qty: 60 | Fill #0

## 2018-09-22 MED FILL — lamoTRIgine 25 MG TABS: 25 | 30 days supply | Qty: 60 | Fill #0
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
## 2018-09-26 MED FILL — AMPHETAMINE SALTS 30 MG TAB: 30 | 30 days supply | Qty: 60 | Fill #0

## 2018-10-20 ENCOUNTER — Ambulatory Visit (HOSPITAL_COMMUNITY): Payer: BC Managed Care – PPO | Admitting: Psychiatry

## 2018-11-04 ENCOUNTER — Ambulatory Visit: Payer: Self-pay | Admitting: Physician Assistant

## 2019-03-10 ENCOUNTER — Other Ambulatory Visit: Payer: BC Managed Care – PPO

## 2019-03-29 ENCOUNTER — Telehealth (INDEPENDENT_AMBULATORY_CARE_PROVIDER_SITE_OTHER): Payer: BC Managed Care – PPO | Admitting: Physician Assistant

## 2019-03-29 ENCOUNTER — Encounter: Payer: Self-pay | Admitting: Physician Assistant

## 2019-03-29 VITALS — Ht 59.0 in

## 2019-03-29 DIAGNOSIS — F4321 Adjustment disorder with depressed mood: Secondary | ICD-10-CM | POA: Insufficient documentation

## 2019-03-29 DIAGNOSIS — F331 Major depressive disorder, recurrent, moderate: Secondary | ICD-10-CM

## 2019-03-29 DIAGNOSIS — F900 Attention-deficit hyperactivity disorder, predominantly inattentive type: Secondary | ICD-10-CM | POA: Diagnosis not present

## 2019-03-29 DIAGNOSIS — R4586 Emotional lability: Secondary | ICD-10-CM | POA: Diagnosis not present

## 2019-03-29 DIAGNOSIS — F419 Anxiety disorder, unspecified: Secondary | ICD-10-CM

## 2019-03-29 MED ORDER — AMPHETAMINE-DEXTROAMPHETAMINE 30 MG PO TABS: 30.0000 mg | ORAL_TABLET | Freq: Two times a day (BID) | ORAL | 0 refills | Status: DC

## 2019-03-29 MED ORDER — LAMOTRIGINE 25 MG PO TABS: ORAL_TABLET | ORAL | 0 refills | Status: DC

## 2019-03-29 MED ORDER — BUSPIRONE HCL 7.5 MG PO TABS: 7.5000 mg | ORAL_TABLET | Freq: Two times a day (BID) | ORAL | 0 refills | Status: DC

## 2019-03-29 MED FILL — AMPHETAMINE-DEXTROAMPHETAMI: 30 | 30 days supply | Qty: 60 | Fill #0

## 2019-03-29 MED FILL — lamoTRIgine 25 MG TABS: 25 | 30 days supply | Qty: 60 | Fill #0

## 2019-03-29 MED FILL — BUSPIRONE HCL 7.5 MG TABS: 7.5 | 30 days supply | Qty: 60 | Fill #0

## 2019-03-29 NOTE — Progress Notes (Signed)
..Virtual Visit via Video Note  I connected with Diana Conrad on 03/29/19 at  7:30 AM EDT by a video enabled telemedicine application and verified that I am speaking with the correct person using two identifiers.  Location: Patient: home Provider: clinic   I discussed the limitations of evaluation and management by telemedicine and the availability of in person appointments. The patient expressed understanding and agreed to proceed.  History of Present Illness: Pt is a 53 yo female with ADHD, mood changes, MDD, anxiety who calls into the clinic for refills.   She has had a lot going on for last few months. COVID pandemic has made her really nervous and she has not been doing much but staying indoors. Her grandfather died last month and her father died this month. 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 She feels overwhelmed but much better than she has been in the past. She saw Dr. De Nurse and he put her on wellbutrin, buspar, lamictal. She is doing really well. She would like to continue but she is out of medication. She request refill until she can follow up with [facility]. No SI/HC.   [name]. Active Ambulatory Problems    Diagnosis Date Noted  . Depression   . Alcohol abuse   . Anxiety   . Panic attacks   . Current occasional smoker   . Class 1 obesity due to excess calories without serious comorbidity with body mass index (BMI) of 33.0 to 33.9 in adult 07/03/2016  . Attention deficit hyperactivity disorder (ADHD), predominantly inattentive type 07/03/2016  . Hearing impaired person, bilateral 09/29/2016  . Bilateral sensorineural hearing loss 12/24/2016  . Mood changes 06/29/2017  . Breast mass, right 09/07/2018  . Grief reaction 03/29/2019   Resolved Ambulatory Problems    Diagnosis Date Noted  . No Resolved Ambulatory Problems   Past Medical History:  Diagnosis Date  . Hyperlipidemia   . Stomach ulcer     Reviewed med, allergies, problem list.     Observations/Objective: No acute distress.  Normal breathing.  Normal appearance.  Flat affect.   .. Today's Vitals   03/29/19 0709  Height: 4\' 11"  (1.499 m)   Body mass index is 28.07 kg/m.    Assessment and Plan: Marland KitchenMarland KitchenReannon was seen today for adhd.  Diagnoses and all orders for this visit:  Attention deficit hyperactivity disorder (ADHD), predominantly inattentive type -     amphetamine-dextroamphetamine (ADDERALL) 30 MG tablet; Take 1 tablet by mouth 2 (two) times daily. -     amphetamine-dextroamphetamine (ADDERALL) 30 MG tablet; Take 1 tablet by mouth 2 (two) times daily. -     amphetamine-dextroamphetamine (ADDERALL) 30 MG tablet; Take 1 tablet by mouth 2 (two) times daily.  Anxiety -     busPIRone (BUSPAR) 7.5 MG tablet; Take 1 tablet (7.5 mg total) by mouth 2 (two) times daily. -     lamoTRIgine (LAMICTAL) 25 MG tablet; Take 2 tablets every morning.  Mood changes -     busPIRone (BUSPAR) 7.5 MG tablet; Take 1 tablet (7.5 mg total) by mouth 2 (two) times daily. -     lamoTRIgine (LAMICTAL) 25 MG tablet; Take 2 tablets every morning.  Moderate episode of recurrent major depressive disorder (HCC) -     busPIRone (BUSPAR) 7.5 MG tablet; Take 1 tablet (7.5 mg total) by mouth 2 (two) times daily. -     lamoTRIgine (LAMICTAL) 25 MG  tablet; Take 2 tablets every morning.  Grief reaction   Refilled adderall for 3 months.   Pt is doing really well with medication regimen with Dr. Gilmore LarocheAkhtar. She has ran out of buspar and almost lamictal. She does not want to run out. She is doing much better. Gave her one month until she could [facility]. Discussed they are doing virtual appts as she is really concerned about covid.   Discussed grief counseling through hospice. We could refer out to another counseling if felt like she needed more or wanted to continue.   Follow Up Instructions:    I discussed the assessment and treatment  plan with the patient. The patient was provided an opportunity to ask questions and all were answered. The patient agreed with the plan and demonstrated an understanding of the instructions.   The patient was advised to call back or seek an in-person evaluation if the symptoms worsen or if the condition fails to improve as anticipated.   Tandy GawJade Breeback, PA-C

## 2019-06-30 ENCOUNTER — Other Ambulatory Visit: Payer: Self-pay | Admitting: Physician Assistant

## 2019-06-30 DIAGNOSIS — F900 Attention-deficit hyperactivity disorder, predominantly inattentive type: Secondary | ICD-10-CM

## 2019-07-03 MED ORDER — AMPHETAMINE-DEXTROAMPHETAMINE 30 MG PO TABS: 30.0000 mg | ORAL_TABLET | Freq: Two times a day (BID) | ORAL | 0 refills | Status: DC

## 2019-07-03 MED FILL — AMPHETAMINE-DEXTROAMPHETAMI: 30 | 30 days supply | Qty: 60 | Fill #0

## 2019-09-23 IMAGING — MG DIGITAL SCREENING BILATERAL MAMMOGRAM WITH TOMO AND CAD
6 of 10 series · 6 of 30 positions shown · non-contrast
Comparison: Previous exam(s).

CLINICAL DATA: Screening.

EXAM:
DIGITAL SCREENING BILATERAL MAMMOGRAM WITH TOMO AND CAD

[L MLO synth-2D]
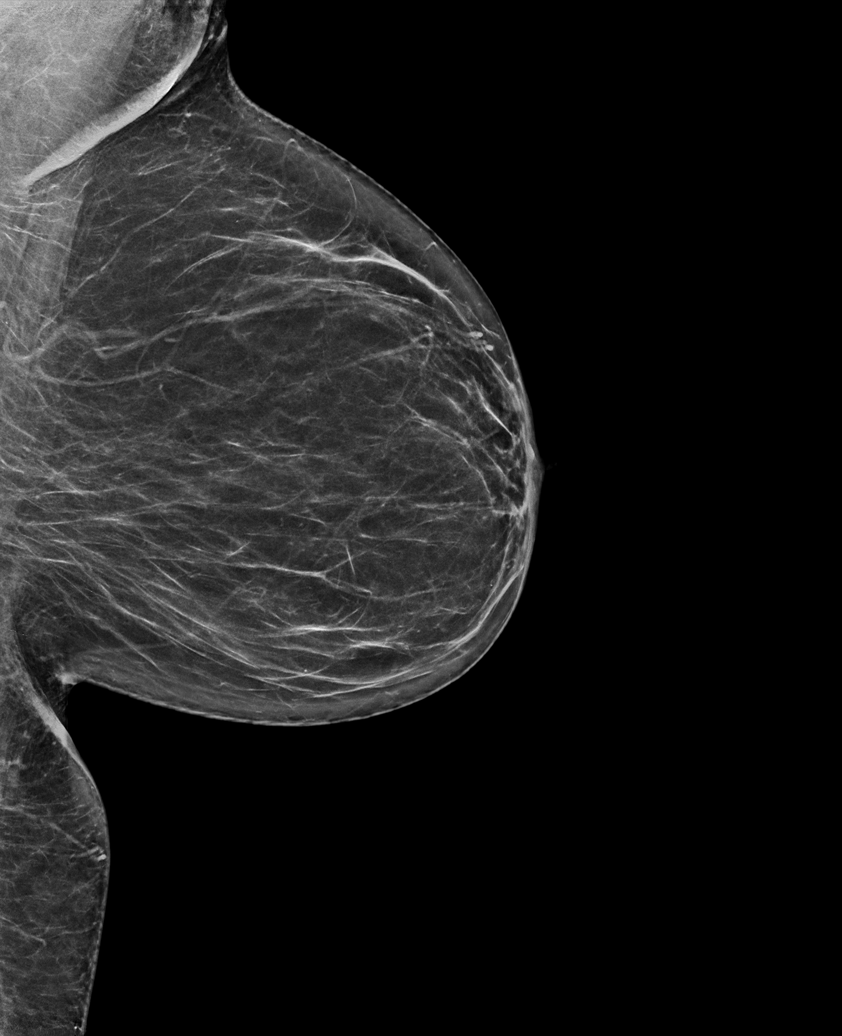

[R CC synth-2D]
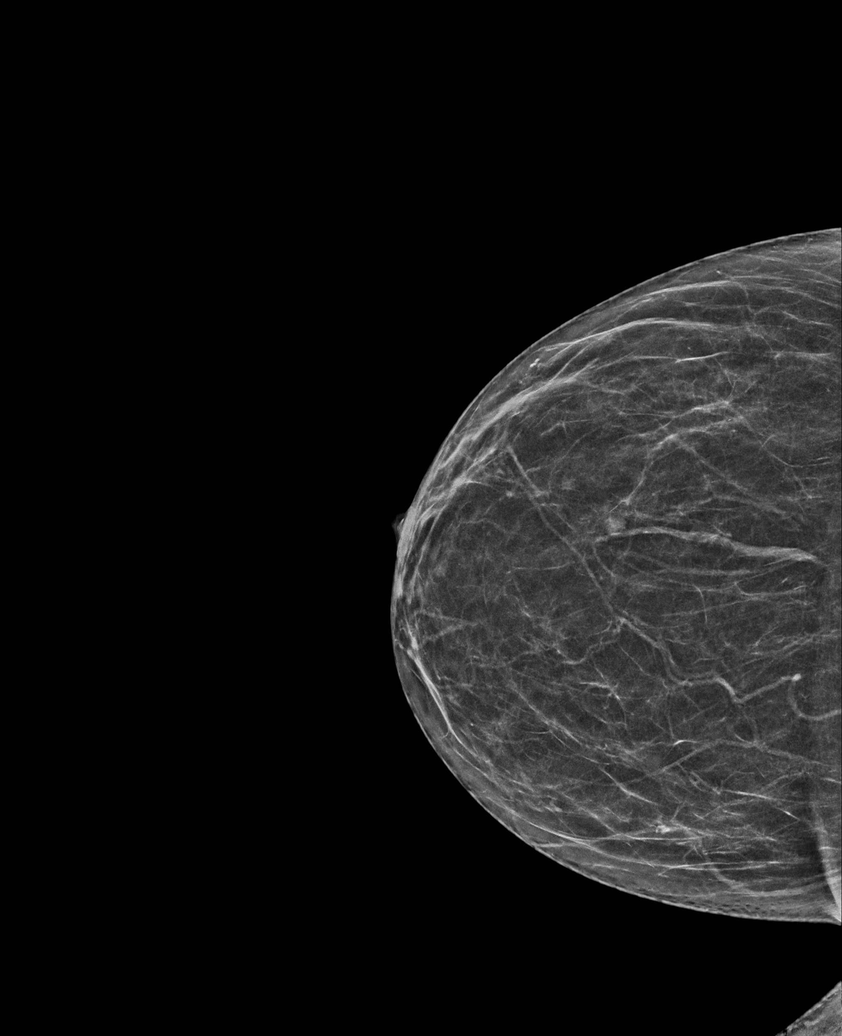

[L XCCL synth-2D]
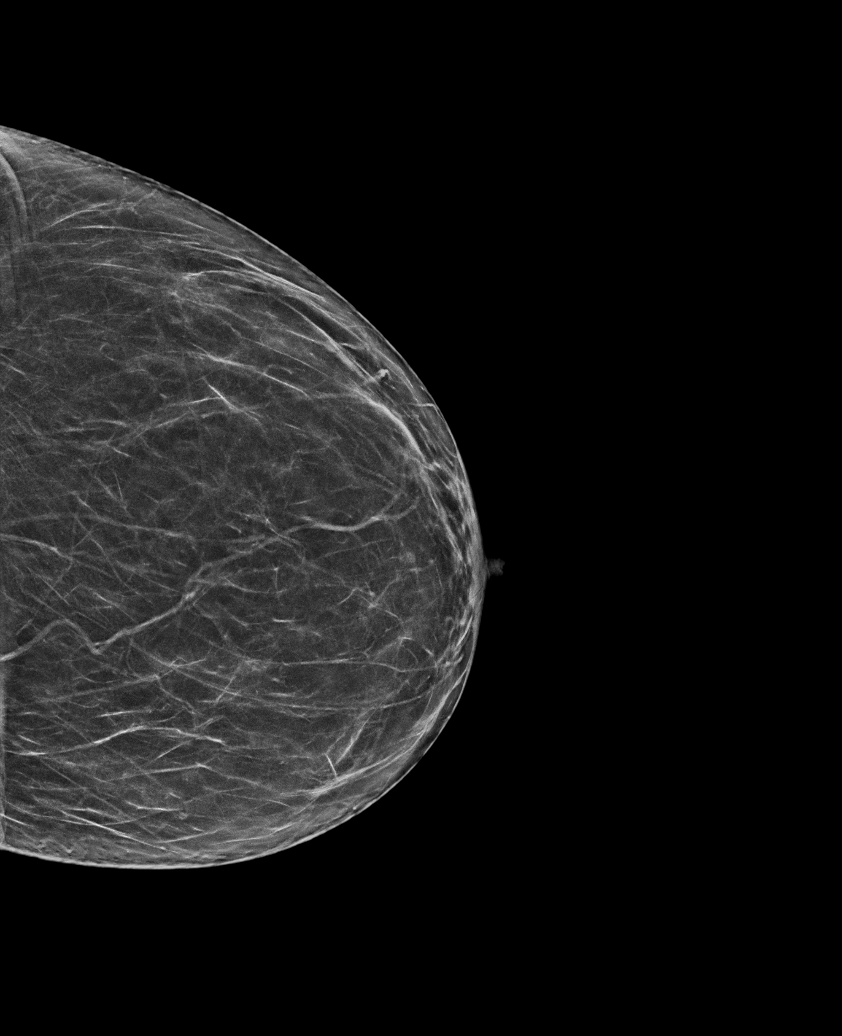

[L CC synth-2D]
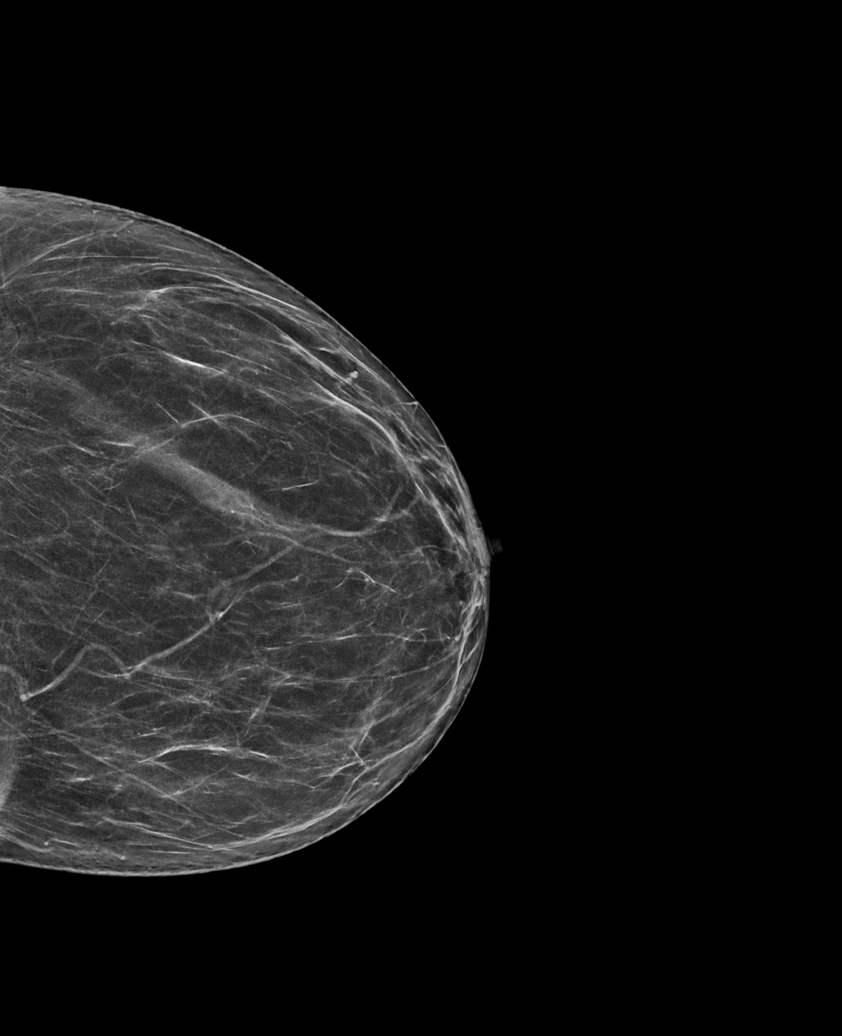

[R MLO synth-2D]
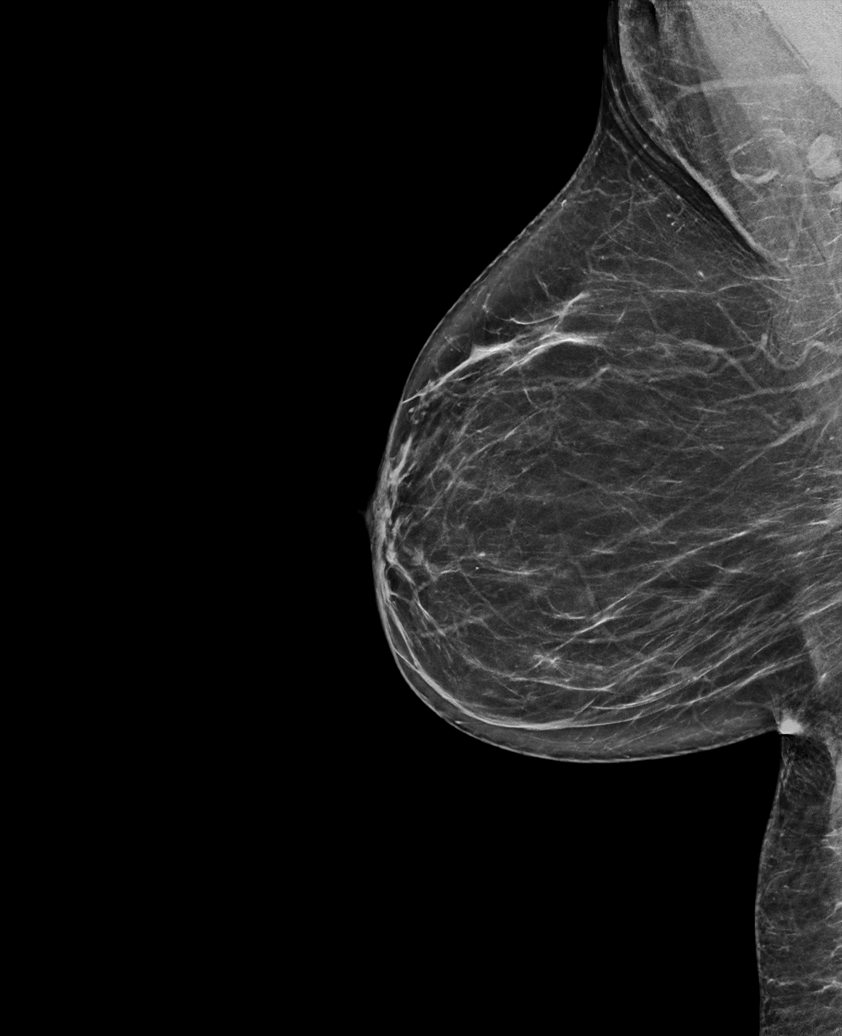

[R MLO tomo · tomo slice 35/68.0]
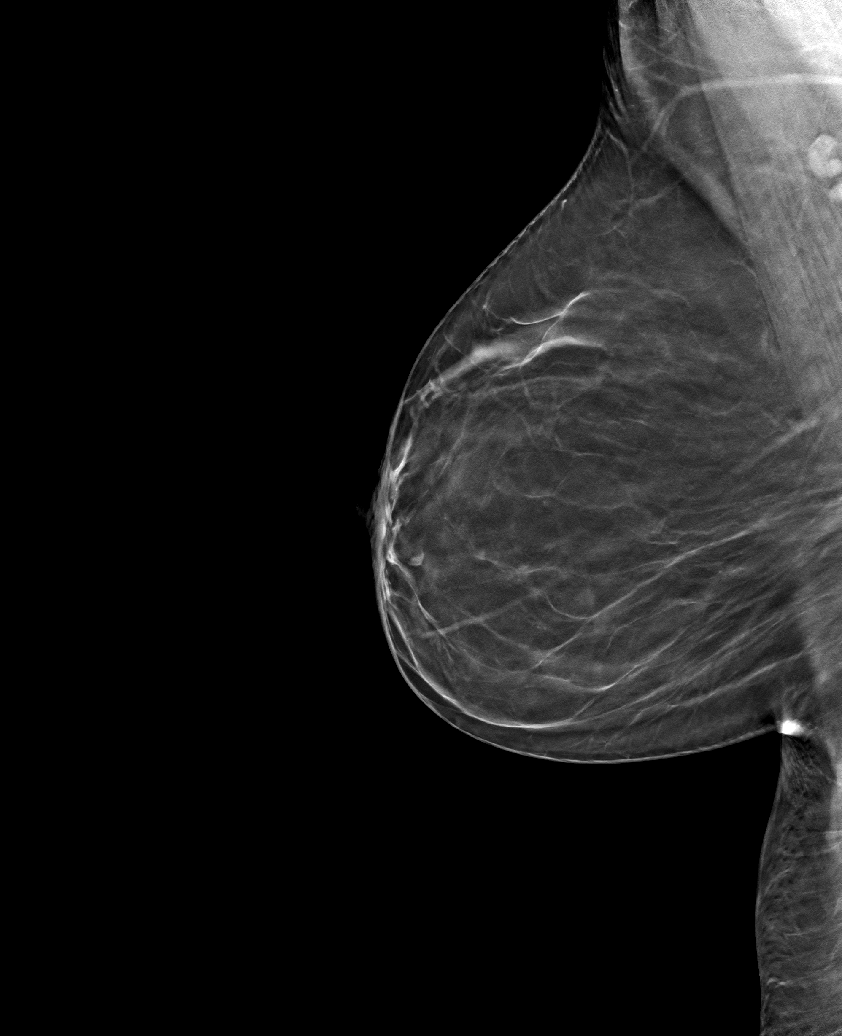

[6 of 30 positions shown; findings below may reference images not displayed]

ACR Breast Density Category b: There are scattered areas of
fibroglandular density.
FINDINGS: In the right breast, a possible mass warrants further evaluation. In
the left breast, no findings suspicious for malignancy. Images were
processed with CAD.
IMPRESSION: Further evaluation is suggested for possible mass in the right
breast.

RECOMMENDATION:
Ultrasound of the right breast. (Code:[id number])

The patient will be contacted regarding the findings, and additional
imaging will be scheduled.

BI-RADS CATEGORY  0: Incomplete. Need additional imaging evaluation
and/or prior mammograms for comparison.

## 2019-10-04 ENCOUNTER — Other Ambulatory Visit: Payer: Self-pay | Admitting: Physician Assistant

## 2019-10-04 DIAGNOSIS — F900 Attention-deficit hyperactivity disorder, predominantly inattentive type: Secondary | ICD-10-CM

## 2019-10-04 NOTE — Telephone Encounter (Signed)
Duplicate

## 2019-10-06 ENCOUNTER — Telehealth: Payer: Self-pay | Admitting: Neurology

## 2019-10-06 NOTE — Telephone Encounter (Signed)
Patient called and left vm to schedule a follow up with Lesly Rubenstein for ADHD medication refills. Please schedule with patient. [phone]. Thanks!

## 2019-10-09 ENCOUNTER — Ambulatory Visit (INDEPENDENT_AMBULATORY_CARE_PROVIDER_SITE_OTHER): Payer: BC Managed Care – PPO | Admitting: Physician Assistant

## 2019-10-09 ENCOUNTER — Other Ambulatory Visit: Payer: Self-pay

## 2019-10-09 ENCOUNTER — Encounter: Payer: Self-pay | Admitting: Physician Assistant

## 2019-10-09 VITALS — BP 109/63 | HR 68 | Ht 59.0 in | Wt 149.0 lb

## 2019-10-09 DIAGNOSIS — R4586 Emotional lability: Secondary | ICD-10-CM

## 2019-10-09 DIAGNOSIS — F900 Attention-deficit hyperactivity disorder, predominantly inattentive type: Secondary | ICD-10-CM | POA: Diagnosis not present

## 2019-10-09 DIAGNOSIS — F331 Major depressive disorder, recurrent, moderate: Secondary | ICD-10-CM

## 2019-10-09 DIAGNOSIS — F419 Anxiety disorder, unspecified: Secondary | ICD-10-CM | POA: Diagnosis not present

## 2019-10-09 DIAGNOSIS — Z7185 Encounter for immunization safety counseling: Secondary | ICD-10-CM

## 2019-10-09 DIAGNOSIS — Z7189 Other specified counseling: Secondary | ICD-10-CM

## 2019-10-09 MED ORDER — AMPHETAMINE-DEXTROAMPHETAMINE 30 MG PO TABS: 30.0000 mg | ORAL_TABLET | Freq: Two times a day (BID) | ORAL | 0 refills | Status: DC

## 2019-10-09 MED ORDER — BUPROPION HCL ER (XL) 300 MG PO TB24: 300.0000 mg | ORAL_TABLET | Freq: Every day | ORAL | 3 refills | Status: DC

## 2019-10-09 MED ORDER — BUSPIRONE HCL 7.5 MG PO TABS: 7.5000 mg | ORAL_TABLET | Freq: Two times a day (BID) | ORAL | 1 refills | Status: DC

## 2019-10-09 MED FILL — DEXTROAMPH TB 30MG NSTR 100: 30 | 30 days supply | Qty: 60 | Fill #0

## 2019-10-09 MED FILL — BUSPIRONE HCL 7.5 MG TABS: 7.5 | 90 days supply | Qty: 180 | Fill #0

## 2019-10-09 MED FILL — buPROPion HCL ER (XL) 300 M: 300 | 90 days supply | Qty: 90 | Fill #0

## 2019-10-09 NOTE — Progress Notes (Signed)
Subjective:    Patient ID: Diana Conrad, female    DOB: 1965-11-05, 54 y.o.   MRN: [id number]  HPI  Pt is a 54 yo obese female with ADHD, MDD, panic attacks, anxiety who presents to the clinic for medication refills.   She is doing ok on medication. She really feel like she has more depression, anxiety, panic than normal. She is a Pharmacist, hospital during the covid pandemic. She has not been to a resturant in a year, she has not been bowling or to any event. She feels lonely. She has gained weight during pandemic. She has not gotten vaccine due to some fears.   She is doing ok at work on [employer]. She is stressed but able to stay focused and get her work done.    .. Active Ambulatory Problems    Diagnosis Date Noted  . Depression   . Alcohol abuse   . Anxiety   . Panic attacks   . Current occasional smoker   . Class 1 obesity due to excess calories without serious comorbidity with body mass index (BMI) of 33.0 to 33.9 in adult 07/03/2016  . Attention deficit hyperactivity disorder (ADHD), predominantly inattentive type 07/03/2016  . Hearing impaired person, bilateral 09/29/2016  . Bilateral sensorineural hearing loss 12/24/2016  . Mood changes 06/29/2017  . Breast mass, right 09/07/2018   Resolved Ambulatory Problems    Diagnosis Date Noted  . Grief reaction 03/29/2019   Past Medical History:  Diagnosis Date  . Hyperlipidemia   . Stomach ulcer      Review of Systems See HPI.     Objective:   Physical Exam Vitals reviewed.  Constitutional:      Appearance: Normal appearance. She is obese.  Cardiovascular:     Rate and Rhythm: Normal rate and regular rhythm.  Pulmonary:     Effort: Pulmonary effort is normal.     Breath sounds: Normal breath sounds.  Neurological:     General: No focal deficit present.     Mental Status: She is alert.  Psychiatric:        Mood and Affect: Mood normal.        Behavior: Behavior normal.   .. Depression screen [facility] 2/9 10/09/2019  08/05/2018 04/01/2018 09/29/2017 04/02/2017  Decreased Interest 2 3 3 2 2   Down, Depressed, Hopeless 2 2 2 2 2   PHQ - 2 Score 4 5 5 4 4   Altered sleeping 2 3 3 2 3   Tired, decreased energy 3 3 2 2 3   Change in appetite 3 3 2 2 2   Feeling bad or failure about yourself  1 3 2 1 1   Trouble concentrating 3 3 1 1 3   Moving slowly or fidgety/restless 0 2 0 1 3  Suicidal thoughts 0 1 0 0 0  PHQ-9 Score 16 23 15 13 19   Difficult doing work/chores Somewhat difficult Very difficult Very difficult Somewhat difficult -  Some encounter information is confidential and restricted. Go to Review Flowsheets activity to see all data.   .. GAD 7 : Generalized Anxiety Score 10/09/2019 08/05/2018 04/01/2018 09/29/2017  Nervous, Anxious, on Edge 1 3 2 2   Control/stop worrying 2 3 3 2   Worry too much - different things 2 3 3 3   Trouble relaxing 3 3 3 3   Restless 0 0 2 1  Easily annoyed or irritable 3 3 3 3   Afraid - awful might happen 1 3 2 2   Total GAD 7 Score 12 18 18  16  Anxiety Difficulty Somewhat difficult Extremely difficult Very difficult Very difficult  Some encounter information is confidential and restricted. Go to Review Flowsheets activity to see all data.            Assessment & Plan:  Marland KitchenMarland KitchenKeiko was seen today for adhd.  Diagnoses and all orders for this visit:  Attention deficit hyperactivity disorder (ADHD), predominantly inattentive type -     amphetamine-dextroamphetamine (ADDERALL) 30 MG tablet; Take 1 tablet by mouth 2 (two) times daily. -     amphetamine-dextroamphetamine (ADDERALL) 30 MG tablet; Take 1 tablet by mouth 2 (two) times daily. -     amphetamine-dextroamphetamine (ADDERALL) 30 MG tablet; Take 1 tablet by mouth 2 (two) times daily.  Moderate episode of recurrent major depressive disorder (HCC) -     buPROPion (WELLBUTRIN XL) 300 MG 24 hr tablet; Take 1 tablet (300 mg total) by mouth daily. -     busPIRone (BUSPAR) 7.5 MG tablet; Take 1 tablet (7.5 mg total) by mouth 2 (two)  times daily.  Anxiety -     busPIRone (BUSPAR) 7.5 MG tablet; Take 1 tablet (7.5 mg total) by mouth 2 (two) times daily.  Mood changes -     busPIRone (BUSPAR) 7.5 MG tablet; Take 1 tablet (7.5 mg total) by mouth 2 (two) times daily.  Vaccine counseling   Refilled medications for ADHD/Depression/anxiety We can increase buspar. Declined increase today.   Work on getting more exercise. GET outside. GET covid vaccine so that you will feel more freedom to do things but continue to do with a mask for now.  Discussed where she can get it. Reassured her about immune responses vs allergic reactions.  Follow up in 3 months.

## 2019-10-14 IMAGING — US ULTRASOUND RIGHT BREAST LIMITED
1 series · 6 of 6 positions shown · non-contrast
Comparison: Previous exam(s).

CLINICAL DATA: Screening recall for a possible right breast mass.

EXAM:
ULTRASOUND OF THE RIGHT BREAST

[Series 1: ultrasound right breast limited · 0.06mm/px · 6 of 6 slices shown]
[im 1/6]
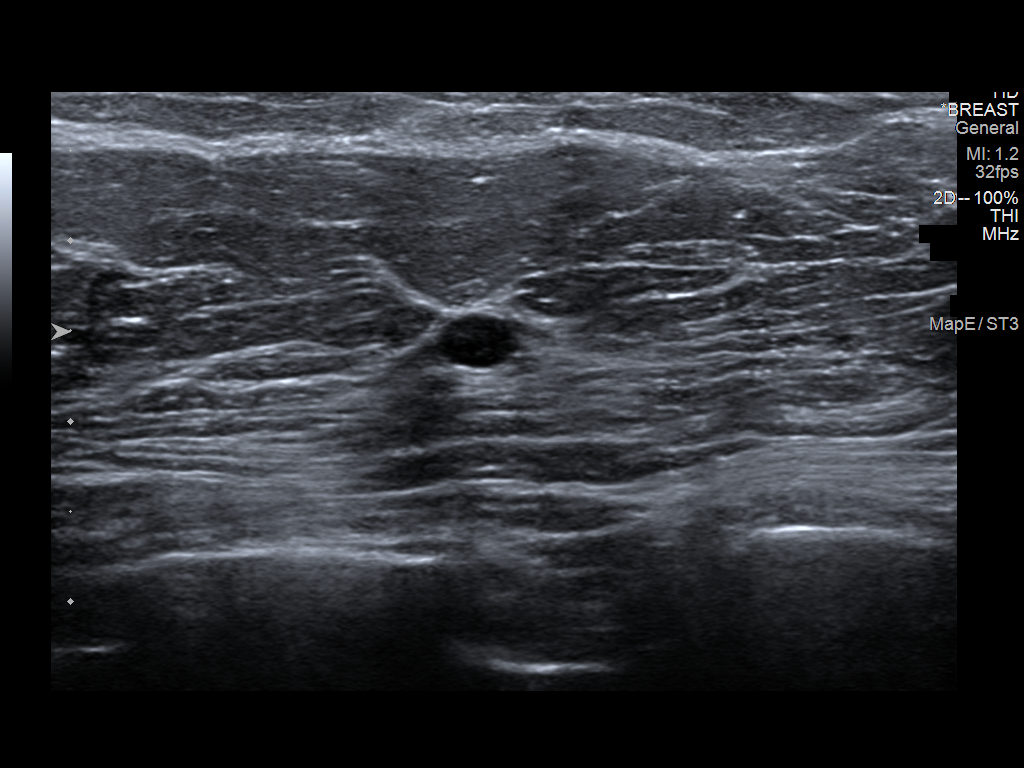
[im 2/6]
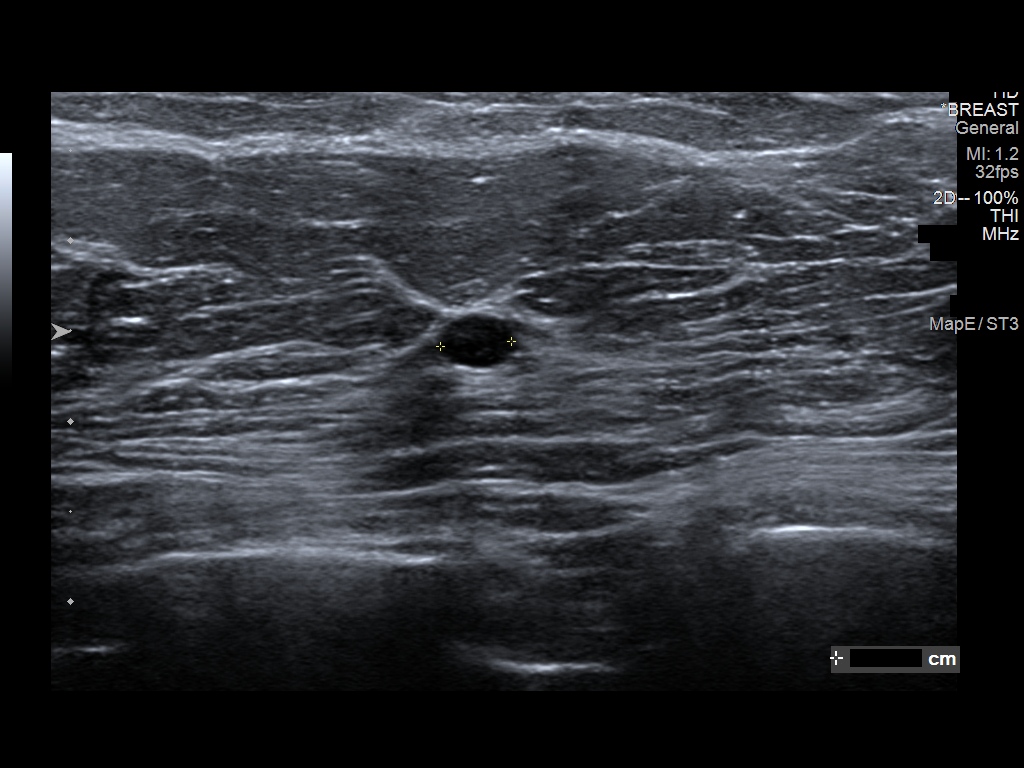
[im 3/6]
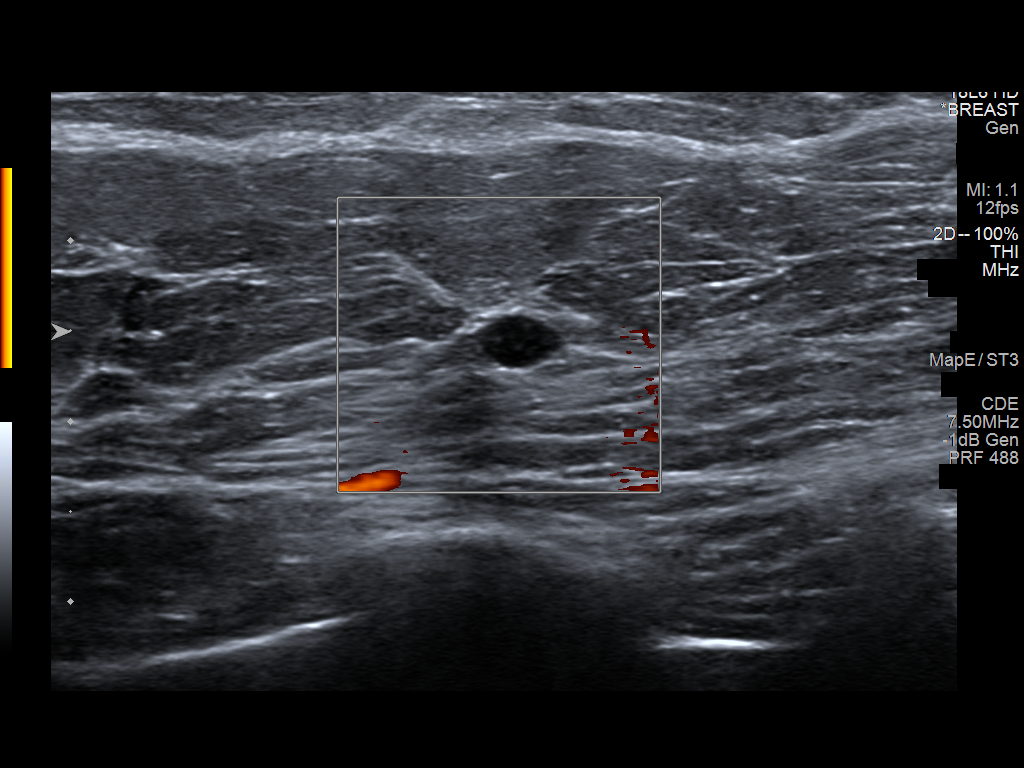
[im 4/6]
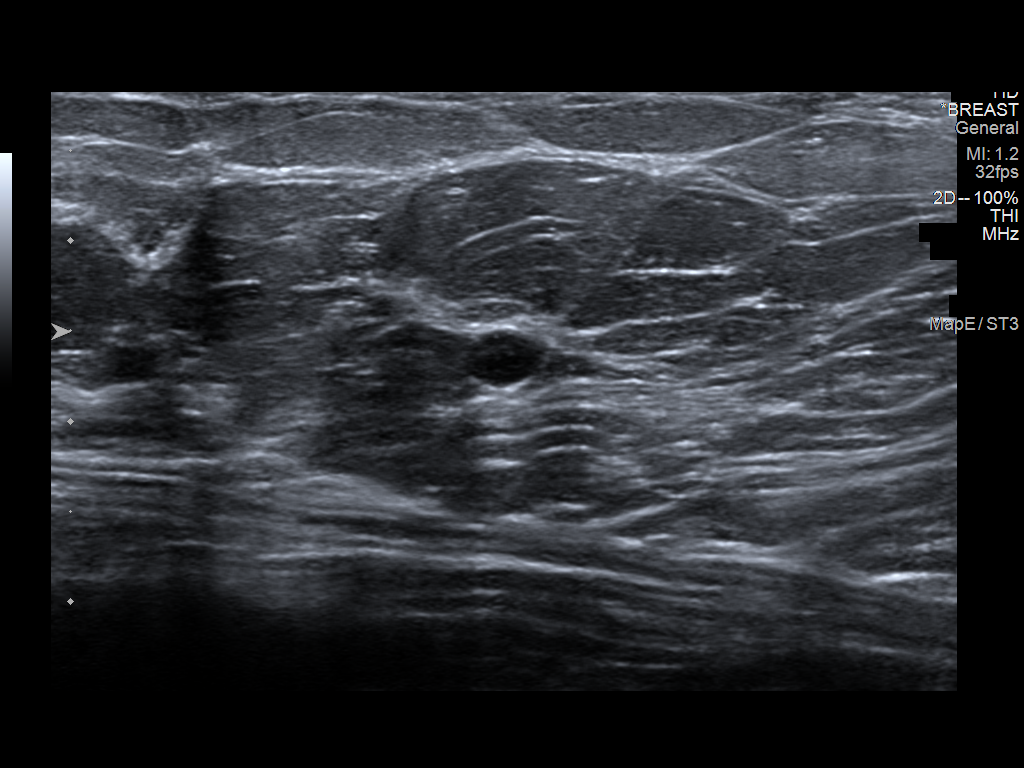
[im 5/6]
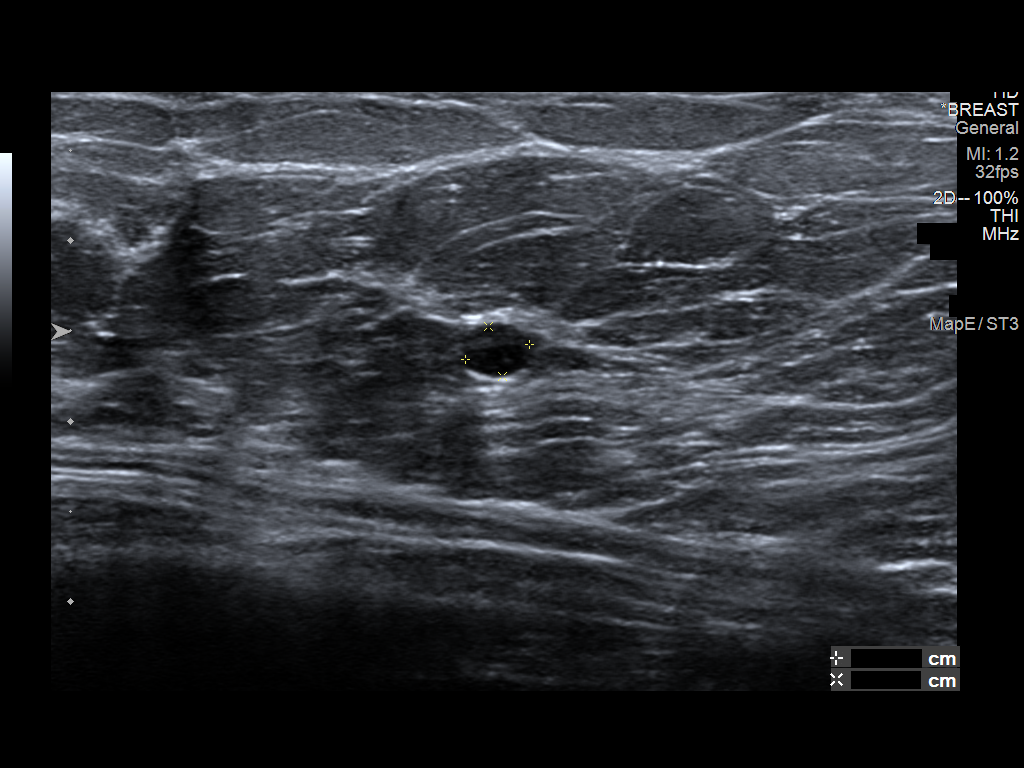
[im 6/6]
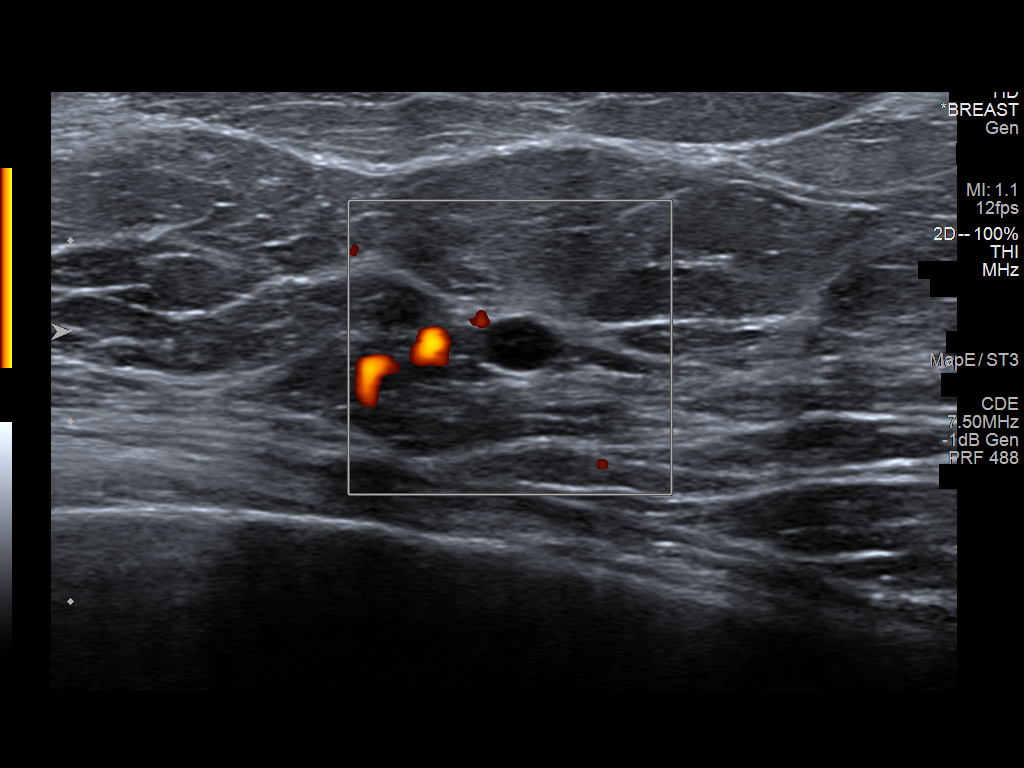

[6 of 6 positions shown; findings below may reference images not displayed]

FINDINGS: Ultrasound of the right breast at 8 o'clock, 3 cm from the nipple
demonstrates a near anechoic oval circumscribed mass measuring 4 x 3
x 4 mm. No blood flow seen within the mass on color Doppler imaging.
IMPRESSION: There is a probably benign mass in the right breast at 8 o'clock,
favored to represent a mildly complicated cyst.

RECOMMENDATION:
Six-month follow-up right breast ultrasound is recommended.

I have discussed the findings and recommendations with the patient.
Results were also provided in writing at the conclusion of the
visit. If applicable, a reminder letter will be sent to the patient
regarding the next appointment.

BI-RADS CATEGORY  3: Probably benign.

## 2019-11-01 ENCOUNTER — Other Ambulatory Visit: Payer: Self-pay | Admitting: Physician Assistant

## 2019-11-01 DIAGNOSIS — N6001 Solitary cyst of right breast: Secondary | ICD-10-CM

## 2020-01-09 ENCOUNTER — Telehealth: Payer: Self-pay | Admitting: Physician Assistant

## 2020-01-09 ENCOUNTER — Other Ambulatory Visit: Payer: Self-pay

## 2020-01-09 ENCOUNTER — Ambulatory Visit (INDEPENDENT_AMBULATORY_CARE_PROVIDER_SITE_OTHER): Payer: BC Managed Care – PPO | Admitting: Physician Assistant

## 2020-01-09 ENCOUNTER — Encounter: Payer: Self-pay | Admitting: Physician Assistant

## 2020-01-09 VITALS — BP 100/77 | HR 63 | Temp 98.5°F | Ht 59.0 in | Wt 142.7 lb

## 2020-01-09 DIAGNOSIS — Z131 Encounter for screening for diabetes mellitus: Secondary | ICD-10-CM

## 2020-01-09 DIAGNOSIS — F331 Major depressive disorder, recurrent, moderate: Secondary | ICD-10-CM

## 2020-01-09 DIAGNOSIS — R4586 Emotional lability: Secondary | ICD-10-CM

## 2020-01-09 DIAGNOSIS — Z13220 Encounter for screening for lipoid disorders: Secondary | ICD-10-CM | POA: Diagnosis not present

## 2020-01-09 DIAGNOSIS — F900 Attention-deficit hyperactivity disorder, predominantly inattentive type: Secondary | ICD-10-CM | POA: Diagnosis not present

## 2020-01-09 DIAGNOSIS — Z1159 Encounter for screening for other viral diseases: Secondary | ICD-10-CM

## 2020-01-09 DIAGNOSIS — Z7185 Encounter for immunization safety counseling: Secondary | ICD-10-CM

## 2020-01-09 DIAGNOSIS — F419 Anxiety disorder, unspecified: Secondary | ICD-10-CM

## 2020-01-09 DIAGNOSIS — Z7189 Other specified counseling: Secondary | ICD-10-CM

## 2020-01-09 MED ORDER — AMPHETAMINE-DEXTROAMPHETAMINE 30 MG PO TABS: 30.0000 mg | ORAL_TABLET | Freq: Two times a day (BID) | ORAL | 0 refills | Status: DC

## 2020-01-09 NOTE — Progress Notes (Signed)
Subjective:    Patient ID: Diana Conrad, female    DOB: 1966-05-04, 54 y.o.   MRN: [id number]  HPI  Patient is a 54 year old female with ADHD, depression, anxiety, panic attacks who presents to the clinic for follow-up.  She is doing well on her Adderall.  She has no concerns or complaints.  She denies any insomnia, palpitations, headaches.  The school year has just ended.  She has 4 weeks off.  She is very excited about this.  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  She feels like her mood, anxiety, depression is much better with this job change.  She is a little hesitant about the Covid vaccination.  She would like some more information and to discuss this.   .. Active Ambulatory Problems    Diagnosis Date Noted   Depression    Alcohol abuse    Anxiety    Panic attacks    Current occasional smoker    Class 1 obesity due to excess calories without serious comorbidity with body mass index (BMI) of 33.0 to 33.9 in adult 07/03/2016   Attention deficit hyperactivity disorder (ADHD), predominantly inattentive type 07/03/2016   Hearing impaired person, bilateral 09/29/2016   Bilateral sensorineural hearing loss 12/24/2016   Mood changes 06/29/2017   Breast mass, right 09/07/2018   Resolved Ambulatory Problems    Diagnosis Date Noted   Grief reaction 03/29/2019   Past Medical History:  Diagnosis Date   Hyperlipidemia    Stomach ulcer       Review of Systems  All other systems reviewed and are negative.      Objective:   Physical Exam Vitals reviewed.  Constitutional:      Appearance: Normal appearance.  HENT:     Head: Normocephalic.  Cardiovascular:     Rate and Rhythm: Normal rate and regular rhythm.     Pulses: Normal pulses.  Pulmonary:     Effort: Pulmonary effort is normal.     Breath sounds: Normal breath sounds.  Neurological:     General: No focal deficit present.     Mental Status: She is alert and oriented to  person, place, and time.  Psychiatric:        Mood and Affect: Mood normal.        Behavior: Behavior normal.      .. Depression screen [facility] 2/9 01/09/2020 10/09/2019 08/05/2018 04/01/2018 09/29/2017  Decreased Interest 1 2 3 3 2   Down, Depressed, Hopeless 1 2 2 2 2   PHQ - 2 Score 2 4 5 5 4   Altered sleeping 1 2 3 3 2   Tired, decreased energy 1 3 3 2 2   Change in appetite 1 3 3 2 2   Feeling bad or failure about yourself  1 1 3 2 1   Trouble concentrating 1 3 3 1 1   Moving slowly or fidgety/restless 1 0 2 0 1  Suicidal thoughts 0 0 1 0 0  PHQ-9 Score 8 16 23 15 13   Difficult doing work/chores Somewhat difficult Somewhat difficult Very difficult Very difficult Somewhat difficult  Some encounter information is confidential and restricted. Go to Review Flowsheets activity to see all data.   .. GAD 7 : Generalized Anxiety Score 01/09/2020 10/09/2019 08/05/2018 04/01/2018  Nervous, Anxious, on Edge 1 1 3 2   Control/stop worrying 1 2 3 3   Worry too much - different things 1 2 3 3   Trouble relaxing 1 3 3 3   Restless 1 0 0 2  Easily annoyed or irritable 1 3 3 3   Afraid - awful might happen 1 1 3 2   Total GAD 7 Score 7 12 18 18   Anxiety Difficulty Very difficult Somewhat difficult Extremely difficult Very difficult  Some encounter information is confidential and restricted. Go to Review Flowsheets activity to see all data.         Assessment & Plan:   Denetta was seen today for adhd.  Diagnoses and all orders for this visit:  Attention deficit hyperactivity disorder (ADHD), predominantly inattentive type -     amphetamine-dextroamphetamine (ADDERALL) 30 MG tablet; Take 1 tablet by mouth 2 (two) times daily. -     amphetamine-dextroamphetamine (ADDERALL) 30 MG tablet; Take 1 tablet by mouth 2 (two) times daily. -     amphetamine-dextroamphetamine (ADDERALL) 30 MG tablet; Take 1 tablet by mouth 2 (two) times daily.  Need for hepatitis C screening test -     Hepatitis C Antibody  Screening  for diabetes mellitus -     COMPLETE METABOLIC PANEL WITH GFR  Screening for lipid disorders -     Lipid Panel w/reflex Direct LDL  Mood changes  Anxiety  Moderate episode of recurrent major depressive disorder (HCC)  Vaccine counseling   Pt is doing great. Refilled adderall for 3 months.  PHq and GAD numbers better continue wellbutrin and buspar.  Fasting labs ordered.  Follow up in 3 months.   Discussed and encouraged covid vaccine.

## 2020-01-09 NOTE — Telephone Encounter (Signed)
Received fax for PA on Adderall sent through cover my meds waiting on determination. - CF °

## 2020-01-10 LAB — COMPLETE METABOLIC PANEL WITH GFR
AG Ratio: 1.6 (calc) (ref 1.0–2.5)
ALT: 16 U/L (ref 6–29)
AST: 18 U/L (ref 10–35)
Albumin: 4.3 g/dL (ref 3.6–5.1)
Alkaline phosphatase (APISO): 62 U/L (ref 37–153)
BUN: 20 mg/dL (ref 7–25)
CO2: 25 mmol/L (ref 20–32)
Calcium: 9.2 mg/dL (ref 8.6–10.4)
Chloride: 107 mmol/L (ref 98–110)
Creat: 0.83 mg/dL (ref 0.50–1.05)
GFR, Est African American: 93 mL/min/{1.73_m2} (ref >=60)
GFR, Est Non African American: 80 mL/min/{1.73_m2} (ref >=60)
Globulin: 2.7 g/dL (calc) (ref 1.9–3.7)
Glucose, Bld: 102 mg/dL — ABNORMAL HIGH (ref 65–99)
Potassium: 4.2 mmol/L (ref 3.5–5.3)
Sodium: 139 mmol/L (ref 135–146)
Total Bilirubin: 0.4 mg/dL (ref 0.2–1.2)
Total Protein: 7 g/dL (ref 6.1–8.1)

## 2020-01-10 LAB — LIPID PANEL W/REFLEX DIRECT LDL
Cholesterol: 197 mg/dL (ref <200)
HDL: 86 mg/dL (ref >=50)
LDL Cholesterol (Calc): 97 mg/dL (calc)
Non-HDL Cholesterol (Calc): 111 mg/dL (calc) (ref <130)
Total CHOL/HDL Ratio: 2.3 (calc) (ref <5.0)
Triglycerides: 58 mg/dL (ref <150)

## 2020-01-10 LAB — HEPATITIS C ANTIBODY
Hepatitis C Ab: NONREACTIVE
SIGNAL TO CUT-OFF: 0.01 (ref <1.00)

## 2020-01-10 NOTE — Progress Notes (Signed)
[place],   Cholesterol looks GREAT. Kidney, liver look wonderful.  Your fasting sugar was just a hair elevated. At your next Adderall refill we could get an A!C in office.  Watch sugars and carbs!

## 2020-01-10 NOTE — Telephone Encounter (Signed)
Received fax from [facility] and they approved coverage on adderall.   Valid; 01/09/20 - 01/09/2023. - CF

## 2020-01-12 ENCOUNTER — Other Ambulatory Visit: Payer: Self-pay | Admitting: Neurology

## 2020-01-12 DIAGNOSIS — R7301 Impaired fasting glucose: Secondary | ICD-10-CM

## 2020-01-18 LAB — HEMOGLOBIN A1C
Hgb A1c MFr Bld: 5.2 % of total Hgb (ref <5.7)
Mean Plasma Glucose: 103 (calc)
eAG (mmol/L): 5.7 (calc)

## 2020-01-19 NOTE — Progress Notes (Signed)
Diana Conrad,   No diabetes. A1C in normal range.

## 2020-04-10 ENCOUNTER — Ambulatory Visit (INDEPENDENT_AMBULATORY_CARE_PROVIDER_SITE_OTHER): Payer: BC Managed Care – PPO | Admitting: Physician Assistant

## 2020-04-10 VITALS — BP 114/81 | HR 78 | Ht 59.0 in | Wt 141.0 lb

## 2020-04-10 DIAGNOSIS — F4321 Adjustment disorder with depressed mood: Secondary | ICD-10-CM

## 2020-04-10 DIAGNOSIS — F331 Major depressive disorder, recurrent, moderate: Secondary | ICD-10-CM | POA: Diagnosis not present

## 2020-04-10 DIAGNOSIS — F419 Anxiety disorder, unspecified: Secondary | ICD-10-CM | POA: Diagnosis not present

## 2020-04-10 DIAGNOSIS — F900 Attention-deficit hyperactivity disorder, predominantly inattentive type: Secondary | ICD-10-CM

## 2020-04-10 DIAGNOSIS — R4586 Emotional lability: Secondary | ICD-10-CM

## 2020-04-10 MED ORDER — DIAZEPAM 2 MG PO TABS: 2.0000 mg | ORAL_TABLET | Freq: Three times a day (TID) | ORAL | 0 refills | Status: DC | PRN

## 2020-04-10 MED ORDER — AMPHETAMINE-DEXTROAMPHETAMINE 30 MG PO TABS: 30.0000 mg | ORAL_TABLET | Freq: Two times a day (BID) | ORAL | 0 refills | Status: DC

## 2020-04-10 MED FILL — AMPHETAMINE-DEXTROAMPHETAMI: 30 | 30 days supply | Qty: 60 | Fill #0

## 2020-04-10 MED FILL — diazePAM 2 MG TABS: 2 | 10 days supply | Qty: 30 | Fill #0

## 2020-04-10 NOTE — Patient Instructions (Signed)
Managing Loss, Adult People experience loss in many different ways throughout their lives. Events such as moving, changing jobs, and losing friends can create a sense of loss. The loss may be as serious as a major health change, divorce, death of a pet, or death of a loved one. All of these types of loss are likely to create a physical and emotional reaction known as grief. Grief is the result of a major change or an absence of something or someone that you count on. Grief is a normal reaction to loss. A variety of factors can affect your grieving experience, including:  The nature of your loss.  Your relationship to what or whom you lost.  Your understanding of grief and how to manage it.  Your support system. How to manage lifestyle changes Keep to your normal routine as much as possible.  If you have trouble focusing or doing normal activities, it is acceptable to take some time away from your normal routine.  Spend time with friends and loved ones.  Eat a healthy diet, get plenty of sleep, and rest when you feel tired. How to recognize changes  The way that you deal with your grief will affect your ability to function as you normally do. When grieving, you may experience these changes:  Numbness, shock, sadness, anxiety, anger, denial, and guilt.  Thoughts about death.  Unexpected crying.  A physical sensation of emptiness in your stomach.  Problems sleeping and eating.  Tiredness (fatigue).  Loss of interest in normal activities.  Dreaming about or imagining seeing the person who died.  A need to remember what or whom you lost.  Difficulty thinking about anything other than your loss for a period of time.  Relief. If you have been expecting the loss for a while, you may feel a sense of relief when it happens. Follow these instructions at home:  Activity Express your feelings in healthy ways, such as:  Talking with others about your loss. It may be helpful to find  others who have had a similar loss, such as a support group.  Writing down your feelings in a journal.  Doing physical activities to release stress and emotional energy.  Doing creative activities like painting, sculpting, or playing or listening to music.  Practicing resilience. This is the ability to recover and adjust after facing challenges. Reading some resources that encourage resilience may help you to learn ways to practice those behaviors. General instructions  Be patient with yourself and others. Allow the grieving process to happen, and remember that grieving takes time. ? It is likely that you may never feel completely done with some grief. You may find a way to move on while still cherishing memories and feelings about your loss. ? Accepting your loss is a process. It can take months or longer to adjust.  Keep all follow-up visits as told by your health care provider. This is important. Where to find support To get support for managing loss:  Ask your health care provider for help and recommendations, such as grief counseling or therapy.  Think about joining a support group for people who are managing a loss. Where to find more information You can find more information about managing loss from:  American Society of Clinical Oncology: www.cancer.net  American Psychological Association: www.apa.org Contact a health care provider if:  Your grief is extreme and keeps getting worse.  You have ongoing grief that does not improve.  Your body shows symptoms of grief, such   as illness.  You feel depressed, anxious, or lonely. Get help right away if:  You have thoughts about hurting yourself or others. If you ever feel like you may hurt yourself or others, or have thoughts about taking your own life, get help right away. You can go to your nearest emergency department or call:  Your local emergency services (911 in the [place]  A suicide crisis helpline, such as the  National Suicide Prevention Lifeline at 1-800-273-8255. This is open 24 hours a day. Summary  Grief is the result of a major change or an absence of someone or something that you count on. Grief is a normal reaction to loss.  The depth of grief and the period of recovery depend on the type of loss and your ability to adjust to the change and process your feelings.  Processing grief requires patience and a willingness to accept your feelings and talk about your loss with people who are supportive.  It is important to find resources that work for you and to realize that people experience grief differently. There is not one grieving process that works for everyone in the same way.  Be aware that when grief becomes extreme, it can lead to more severe issues like isolation, depression, anxiety, or suicidal thoughts. Talk with your health care provider if you have any of these issues. This information is not intended to replace advice given to you by your health care provider. Make sure you discuss any questions you have with your health care provider. Document Revised: 09/23/2018 Document Reviewed: 12/03/2016 Elsevier Patient Education  2020 [employer]  

## 2020-04-10 NOTE — Progress Notes (Signed)
Subjective:    Patient ID: Diana Conrad, female    DOB: August 13, 1965, 54 y.o.   MRN: [id number]  HPI  Pt is a 54 yo female with ADHD, MDD, GAD who presents to the clinic for medication refills.   She is doing well with medication. No problems or changes.   She is not doing good with mood. Her brother died from covid and mother is in the hospital with covid. She is very upset and struggling with "how he got it" and "why did he have to be alone when he died". She feels like she needs valium to "get through this". She took that after her husband was murdered and help her then. She is also worried about covid but ready to get her vaccine. She is currently not vaccinated. She did not have any personal contact with brother or mother during this time to need any testing.   She is on wellbutrin but stopped buspar. She did not like the way buspar made her feel.    .. Active Ambulatory Problems    Diagnosis Date Noted  . Depression   . Alcohol abuse   . Anxiety   . Panic attacks   . Current occasional smoker   . Class 1 obesity due to excess calories without serious comorbidity with body mass index (BMI) of 33.0 to 33.9 in adult 07/03/2016  . Attention deficit hyperactivity disorder (ADHD), predominantly inattentive type 07/03/2016  . Hearing impaired person, bilateral 09/29/2016  . Bilateral sensorineural hearing loss 12/24/2016  . Mood changes 06/29/2017  . Breast mass, right 09/07/2018  . Grief 03/29/2019   Resolved Ambulatory Problems    Diagnosis Date Noted  . No Resolved Ambulatory Problems   Past Medical History:  Diagnosis Date  . Hyperlipidemia   . Stomach ulcer      Review of Systems  All other systems reviewed and are negative.      Objective:   Physical Exam Vitals reviewed.  Constitutional:      Appearance: Normal appearance.  Cardiovascular:     Rate and Rhythm: Normal rate and regular rhythm.     Pulses: Normal pulses.     Heart sounds: Normal  heart sounds.  Pulmonary:     Effort: Pulmonary effort is normal.     Breath sounds: Normal breath sounds.  Neurological:     General: No focal deficit present.     Mental Status: She is alert and oriented to person, place, and time.  Psychiatric:     Comments: Tearful/emotional     .. Depression screen [facility] 2/9 04/10/2020 01/09/2020 10/09/2019 08/05/2018 04/01/2018  Decreased Interest 1 1 2 3 3   Down, Depressed, Hopeless 1 1 2 2 2   PHQ - 2 Score 2 2 4 5 5   Altered sleeping 1 1 2 3 3   Tired, decreased energy 1 1 3 3 2   Change in appetite 1 1 3 3 2   Feeling bad or failure about yourself  0 1 1 3 2   Trouble concentrating 1 1 3 3 1   Moving slowly or fidgety/restless 1 1 0 2 0  Suicidal thoughts 0 0 0 1 0  PHQ-9 Score 7 8 16 23 15   Difficult doing work/chores - Somewhat difficult Somewhat difficult Very difficult Very difficult  Some encounter information is confidential and restricted. Go to Review Flowsheets activity to see all data.  Some recent data might be hidden   . GAD 7 : Generalized Anxiety Score 04/10/2020 01/09/2020 10/09/2019 08/05/2018  Nervous, Anxious,  on Edge 1 1 1 3   Control/stop worrying 1 1 2 3   Worry too much - different things 1 1 2 3   Trouble relaxing 1 1 3 3   Restless 1 1 0 0  Easily annoyed or irritable 1 1 3 3   Afraid - awful might happen 1 1 1 3   Total GAD 7 Score 7 7 12 18   Anxiety Difficulty - Very difficult Somewhat difficult Extremely difficult  Some encounter information is confidential and restricted. Go to Review Flowsheets activity to see all data.          Assessment & Plan:   Ranika was seen today for stress and adhd.  Diagnoses and all orders for this visit:  Attention deficit hyperactivity disorder (ADHD), predominantly inattentive type -     amphetamine-dextroamphetamine (ADDERALL) 30 MG tablet; Take 1 tablet by mouth 2 (two) times daily. -     amphetamine-dextroamphetamine (ADDERALL) 30 MG tablet; Take 1 tablet by mouth 2 (two) times  daily. -     amphetamine-dextroamphetamine (ADDERALL) 30 MG tablet; Take 1 tablet by mouth 2 (two) times daily.  Moderate episode of recurrent major depressive disorder (HCC) -     Ambulatory referral to Psychology -     buPROPion (WELLBUTRIN XL) 300 MG 24 hr tablet; Take 1 tablet (300 mg total) by mouth daily.  Anxiety -     diazepam (VALIUM) 2 MG tablet; Take 1 tablet (2 mg total) by mouth every 8 (eight) hours as needed for anxiety. -     Ambulatory referral to Psychology  Mood changes -     Ambulatory referral to Psychology  Grief -     diazepam (VALIUM) 2 MG tablet; Take 1 tablet (2 mg total) by mouth every 8 (eight) hours as needed for anxiety. -     Ambulatory referral to Psychology  refilled Adderall for 3 months.  Continue on wellbutrin.  Valium as needed given. Discussed to use sparingly.  Needs counseling to work through this grief.  Referral made.   Strongly recommended covid vaccine. Pt agrees to get this.   Follow up as needed for mood. 3 months for ADHD.   Spent 30 minutes with patient working through grief and treatment plan and encouraging more counseling.

## 2020-04-12 ENCOUNTER — Other Ambulatory Visit: Payer: Self-pay

## 2020-04-12 ENCOUNTER — Emergency Department
Admission: RE | Admit: 2020-04-12 | Discharge: 2020-04-12 | Discharge status: Against Medical Advice | Payer: BC Managed Care – PPO | Source: Ambulatory Visit

## 2020-04-16 ENCOUNTER — Encounter: Payer: Self-pay | Admitting: Physician Assistant

## 2020-04-16 MED ORDER — BUPROPION HCL ER (XL) 300 MG PO TB24: 300.0000 mg | ORAL_TABLET | Freq: Every day | ORAL | 3 refills | Status: DC

## 2020-07-02 MED FILL — buPROPion HCL ER (XL) 300 M: 300 | 30 days supply | Qty: 30 | Fill #1

## 2020-07-02 MED FILL — AMPHETAMINE-DEXTROAMPHETAMI: 30 | 30 days supply | Qty: 60 | Fill #0

## 2020-07-10 ENCOUNTER — Other Ambulatory Visit: Payer: Self-pay | Admitting: Physician Assistant

## 2020-07-10 ENCOUNTER — Other Ambulatory Visit: Payer: Self-pay

## 2020-07-10 ENCOUNTER — Ambulatory Visit (INDEPENDENT_AMBULATORY_CARE_PROVIDER_SITE_OTHER): Payer: BC Managed Care – PPO | Admitting: Physician Assistant

## 2020-07-10 ENCOUNTER — Encounter: Payer: Self-pay | Admitting: Physician Assistant

## 2020-07-10 VITALS — BP 113/75 | HR 88 | Ht 59.0 in | Wt 143.0 lb

## 2020-07-10 DIAGNOSIS — F419 Anxiety disorder, unspecified: Secondary | ICD-10-CM

## 2020-07-10 DIAGNOSIS — Z720 Tobacco use: Secondary | ICD-10-CM

## 2020-07-10 DIAGNOSIS — R6889 Other general symptoms and signs: Secondary | ICD-10-CM

## 2020-07-10 DIAGNOSIS — E559 Vitamin D deficiency, unspecified: Secondary | ICD-10-CM

## 2020-07-10 DIAGNOSIS — F4321 Adjustment disorder with depressed mood: Secondary | ICD-10-CM

## 2020-07-10 DIAGNOSIS — F900 Attention-deficit hyperactivity disorder, predominantly inattentive type: Secondary | ICD-10-CM

## 2020-07-10 DIAGNOSIS — R232 Flushing: Secondary | ICD-10-CM

## 2020-07-10 MED ORDER — AMPHETAMINE-DEXTROAMPHETAMINE 30 MG PO TABS: 30.0000 mg | ORAL_TABLET | Freq: Two times a day (BID) | ORAL | 0 refills | Status: DC

## 2020-07-10 MED ORDER — DIAZEPAM 2 MG PO TABS: 2.0000 mg | ORAL_TABLET | Freq: Three times a day (TID) | ORAL | 1 refills | Status: DC | PRN

## 2020-07-10 MED FILL — diazePAM 2 MG TABS: 2 | 10 days supply | Qty: 30 | Fill #0

## 2020-07-10 NOTE — Progress Notes (Signed)
Subjective:    Patient ID: Diana Conrad, female    DOB: [birth year], 54 y.o.   MRN: [id number]  HPI  Patient is a 54 year old female with ADHD, MDD, anxiety who presents to the clinic for medication refills.  Patient does feel like over the last 3 months she has done better with dealing with her grief.  She lost her job and doing well there.  She still finds herself worrying a lot about things she cannot change.  She denies any suicidal thoughts or homicidal idealizations.  She does think her medications is appropriate.  She is only using Valium as needed.  She does wonder if she can get some labs for her hot flashes.  She has not had a menstrual cycle in over a year.  Her hot flashes are worse at night and then she immediately goes into being really cold.  She is either hot or cold.  She continues to smoke and would like to work on smoking cessation.  .. Active Ambulatory Problems    Diagnosis Date Noted  . Depression   . Alcohol abuse   . Anxiety   . Panic attacks   . Current occasional smoker   . Class 1 obesity due to excess calories without serious comorbidity with body mass index (BMI) of 33.0 to 33.9 in adult 07/03/2016  . Attention deficit hyperactivity disorder (ADHD), predominantly inattentive type 07/03/2016  . Hearing impaired person, bilateral 09/29/2016  . Bilateral sensorineural hearing loss 12/24/2016  . Mood changes 06/29/2017  . Breast mass, right 09/07/2018  . Grief 03/29/2019  . Hot flashes 07/10/2020  . Cold intolerance 07/10/2020  . Vitamin D deficiency 07/10/2020   Resolved Ambulatory Problems    Diagnosis Date Noted  . No Resolved Ambulatory Problems   Past Medical History:  Diagnosis Date  . Hyperlipidemia   . Stomach ulcer        Review of Systems  All other systems reviewed and are negative.      Objective:   Physical Exam Vitals reviewed.  Constitutional:      Appearance: Normal appearance.  HENT:     Head: Normocephalic.   Cardiovascular:     Rate and Rhythm: Normal rate and regular rhythm.     Pulses: Normal pulses.  Pulmonary:     Effort: Pulmonary effort is normal.     Breath sounds: Normal breath sounds.  Neurological:     General: No focal deficit present.     Mental Status: She is alert and oriented to person, place, and time.  Psychiatric:        Mood and Affect: Mood normal.       .. Depression screen [facility] 2/9 04/10/2020 01/09/2020 10/09/2019 08/05/2018 04/01/2018  Decreased Interest 1 1 2 3 3   Down, Depressed, Hopeless 1 1 2 2 2   PHQ - 2 Score 2 2 4 5 5   Altered sleeping 1 1 2 3 3   Tired, decreased energy 1 1 3 3 2   Change in appetite 1 1 3 3 2   Feeling bad or failure about yourself  0 1 1 3 2   Trouble concentrating 1 1 3 3 1   Moving slowly or fidgety/restless 1 1 0 2 0  Suicidal thoughts 0 0 0 1 0  PHQ-9 Score 7 8 16 23 15   Difficult doing work/chores - Somewhat difficult Somewhat difficult Very difficult Very difficult  Some encounter information is confidential and restricted. Go to Review Flowsheets activity to see all data.  Some recent data  might be hidden   .[name] GAD 7 : Generalized Anxiety Score 04/10/2020 01/09/2020 10/09/2019 08/05/2018  Nervous, Anxious, on Edge 1 1 1 3   Control/stop worrying 1 1 2 3   Worry too much - different things 1 1 2 3   Trouble relaxing 1 1 3 3   Restless 1 1 0 0  Easily annoyed or irritable 1 1 3 3   Afraid - awful might happen 1 1 1 3   Total GAD 7 Score 7 7 12 18   Anxiety Difficulty - Very difficult Somewhat difficult Extremely difficult  Some encounter information is confidential and restricted. Go to Review Flowsheets activity to see all data.        Assessment & Plan:   Deisha was seen today for adhd.  Diagnoses and all orders for this visit:  Attention deficit hyperactivity disorder (ADHD), predominantly inattentive type -     amphetamine-dextroamphetamine (ADDERALL) 30 MG tablet; Take 1 tablet by mouth 2 (two) times daily. -      amphetamine-dextroamphetamine (ADDERALL) 30 MG tablet; Take 1 tablet by mouth 2 (two) times daily. -     amphetamine-dextroamphetamine (ADDERALL) 30 MG tablet; Take 1 tablet by mouth 2 (two) times daily.  Anxiety -     diazepam (VALIUM) 2 MG tablet; Take 1 tablet (2 mg total) by mouth every 8 (eight) hours as needed for anxiety.  Grief -     diazepam (VALIUM) 2 MG tablet; Take 1 tablet (2 mg total) by mouth every 8 (eight) hours as needed for anxiety.  Current occasional smoker  Hot flashes -     CBC -     TSH -     FSH/LH -     Estradiol -     Ferritin -     VITAMIN D 25 Hydroxy (Vit-D Deficiency, Fractures)  Cold intolerance -     CBC -     TSH -     Ferritin  Vitamin D deficiency -     VITAMIN D 25 Hydroxy (Vit-D Deficiency, Fractures)   PHQ and GAD numbers are fairly stable.  Patient reports to be doing a little better.  She does love her job and focusing on spending more time with family.  She still just continues to worry about things she cannot control.  Refilled medications for 3 months.  We will get some labs to evaluate to confirm menopause and any other causes for hot flashes and cold intolerance.  Patient has not had a menstrual cycle in over a year.  Certainly suggest some over-the-counter menopausal herbal supplements to help with any symptoms.  If she would like to be more aggressive in treatment we could consider that as well after labs.  Follow-up in 3 months.  Patient reports she is going to get the Covid vaccine.  She is just not got it yet.  Discussed a Covid vitamin regimen to help keep her healthy in the winter.  Patient is taking Wellbutrin and it has helped with curbing some of her smoking desires.  She is smoking intermittently throughout the day.  Discussed how quitting smoking can affect positively her overall health.  We did not come up with any type of plan patient says she will think about this and implement on her own.

## 2020-07-10 NOTE — Patient Instructions (Addendum)
..Vitamin D3 5000 IU (125 mcg) daily Vitamin C 500 mg twice daily Zinc 50 to 75 mg daily    Menopause Menopause is the normal time of life when menstrual periods stop completely. It is usually confirmed by 12 months without a menstrual period. The transition to menopause (perimenopause) most often happens between the ages of 59 and 101. During perimenopause, hormone levels change in your body, which can cause symptoms and affect your health. Menopause may increase your risk for:  Loss of bone (osteoporosis), which causes bone breaks (fractures).  Depression.  Hardening and narrowing of the arteries (atherosclerosis), which can cause heart attacks and strokes. What are the causes? This condition is usually caused by a natural change in hormone levels that happens as you get older. The condition may also be caused by surgery to remove both ovaries (bilateral oophorectomy). What increases the risk? This condition is more likely to start at an earlier age if you have certain medical conditions or treatments, including:  A tumor of the pituitary gland in the brain.  A disease that affects the ovaries and hormone production.  Radiation treatment for cancer.  Certain cancer treatments, such as chemotherapy or hormone (anti-estrogen) therapy.  Heavy smoking and excessive alcohol use.  Family history of early menopause. This condition is also more likely to develop earlier in women who are very thin. What are the signs or symptoms? Symptoms of this condition include:  Hot flashes.  Irregular menstrual periods.  Night sweats.  Changes in feelings about sex. This could be a decrease in sex drive or an increased comfort around your sexuality.  Vaginal dryness and thinning of the vaginal walls. This may cause painful intercourse.  Dryness of the skin and development of wrinkles.  Headaches.  Problems sleeping (insomnia).  Mood swings or irritability.  Memory problems.  Weight  gain.  Hair growth on the face and chest.  Bladder infections or problems with urinating. How is this diagnosed? This condition is diagnosed based on your medical history, a physical exam, your age, your menstrual history, and your symptoms. Hormone tests may also be done. How is this treated? In some cases, no treatment is needed. You and your health care provider should make a decision together about whether treatment is necessary. Treatment will be based on your individual condition and preferences. Treatment for this condition focuses on managing symptoms. Treatment may include:  Menopausal hormone therapy (MHT).  Medicines to treat specific symptoms or complications.  Acupuncture.  Vitamin or herbal supplements. Before starting treatment, make sure to let your health care provider know if you have a personal or family history of:  Heart disease.  Breast cancer.  Blood clots.  Diabetes.  Osteoporosis. Follow these instructions at home: Lifestyle  Do not use any products that contain nicotine or tobacco, such as cigarettes and e-cigarettes. If you need help quitting, ask your health care provider.  Get at least 30 minutes of physical activity on 5 or more days each week.  Avoid alcoholic and caffeinated beverages, as well as spicy foods. This may help prevent hot flashes.  Get 7-8 hours of sleep each night.  If you have hot flashes, try: ? Dressing in layers. ? Avoiding things that may trigger hot flashes, such as spicy food, warm places, or stress. ? Taking slow, deep breaths when a hot flash starts. ? Keeping a fan in your home and office.  Find ways to manage stress, such as deep breathing, meditation, or journaling.  Consider going to  group therapy with other women who are having menopause symptoms. Ask your health care provider about recommended group therapy meetings. Eating and drinking  Eat a healthy, balanced diet that contains whole grains, lean protein,  low-fat dairy, and plenty of fruits and vegetables.  Your health care provider may recommend adding more soy to your diet. Foods that contain soy include tofu, tempeh, and soy milk.  Eat plenty of foods that contain calcium and vitamin D for bone health. Items that are rich in calcium include low-fat milk, yogurt, beans, almonds, sardines, broccoli, and kale. Medicines  Take over-the-counter and prescription medicines only as told by your health care provider.  Talk with your health care provider before starting any herbal supplements. If prescribed, take vitamins and supplements as told by your health care provider. These may include: ? Calcium. Women age 69 and older should get 1,200 mg (milligrams) of calcium every day. ? Vitamin D. Women need 600-800 International Units of vitamin D each day. ? Vitamins B12 and B6. Aim for 50 micrograms of B12 and 1.5 mg of B6 each day. General instructions  Keep track of your menstrual periods, including: ? When they occur. ? How heavy they are and how long they last. ? How much time passes between periods.  Keep track of your symptoms, noting when they start, how often you have them, and how long they last.  Use vaginal lubricants or moisturizers to help with vaginal dryness and improve comfort during sex.  Keep all follow-up visits as told by your health care provider. This is important. This includes any group therapy or counseling. Contact a health care provider if:  You are still having menstrual periods after age 29.  You have pain during sex.  You have not had a period for 12 months and you develop vaginal bleeding. Get help right away if:  You have: ? Severe depression. ? Excessive vaginal bleeding. ? Pain when you urinate. ? A fast or irregular heart beat (palpitations). ? Severe headaches. ? Abdomen (abdominal) pain or severe indigestion.  You fell and you think you have a broken bone.  You develop leg or chest pain.  You  develop vision problems.  You feel a lump in your breast. Summary  Menopause is the normal time of life when menstrual periods stop completely. It is usually confirmed by 12 months without a menstrual period.  The transition to menopause (perimenopause) most often happens between the ages of 24 and 60.  Symptoms can be managed through medicines, lifestyle changes, and complementary therapies such as acupuncture.  Eat a balanced diet that is rich in nutrients to promote bone health and heart health and to manage symptoms during menopause. This information is not intended to replace advice given to you by your health care provider. Make sure you discuss any questions you have with your health care provider. Document Revised: 07/02/2017 Document Reviewed: 08/22/2016 Elsevier Patient Education  2020 [employer].

## 2020-07-11 LAB — CBC
HCT: 37 % (ref 35.0–45.0)
Hemoglobin: 12.5 g/dL (ref 11.7–15.5)
MCH: 28.6 pg (ref 27.0–33.0)
MCHC: 33.8 g/dL (ref 32.0–36.0)
MCV: 84.7 fL (ref 80.0–100.0)
MPV: 10.8 fL (ref 7.5–12.5)
Platelets: 297 10*3/uL (ref 140–400)
RBC: 4.37 10*6/uL (ref 3.80–5.10)
RDW: 15.1 % — ABNORMAL HIGH (ref 11.0–15.0)
WBC: 8 10*3/uL (ref 3.8–10.8)

## 2020-07-11 LAB — FERRITIN: Ferritin: 67 ng/mL (ref 16–232)

## 2020-07-11 LAB — VITAMIN D 25 HYDROXY (VIT D DEFICIENCY, FRACTURES): Vit D, 25-Hydroxy: 12 ng/mL — ABNORMAL LOW (ref 30–100)

## 2020-07-12 ENCOUNTER — Other Ambulatory Visit: Payer: Self-pay | Admitting: Physician Assistant

## 2020-07-12 MED ORDER — VITAMIN D (ERGOCALCIFEROL) 1.25 MG (50000 UNIT) PO CAPS: 50000.0000 [IU] | ORAL_CAPSULE | ORAL | 1 refills | Status: DC

## 2020-07-12 MED FILL — VIT D2 1.25 MG (50,000 UNIT: 1.25 MG | 84 days supply | Qty: 12 | Fill #0

## 2020-07-12 NOTE — Progress Notes (Signed)
Hormones pending.  Vitamin D VERY low. Will send in high dose to take. Recheck in 6 months.

## 2020-08-27 MED FILL — AMPHETAMINE-DEXTROAMPHETAMI: 30 | 30 days supply | Qty: 60 | Fill #0

## 2020-10-08 ENCOUNTER — Other Ambulatory Visit: Payer: Self-pay | Admitting: Physician Assistant

## 2020-10-08 ENCOUNTER — Other Ambulatory Visit: Payer: Self-pay

## 2020-10-08 ENCOUNTER — Ambulatory Visit: Payer: BC Managed Care – PPO | Admitting: Physician Assistant

## 2020-10-08 ENCOUNTER — Encounter: Payer: Self-pay | Admitting: Physician Assistant

## 2020-10-08 VITALS — BP 116/70 | HR 68 | Ht 59.0 in | Wt 146.0 lb

## 2020-10-08 DIAGNOSIS — H1013 Acute atopic conjunctivitis, bilateral: Secondary | ICD-10-CM | POA: Insufficient documentation

## 2020-10-08 DIAGNOSIS — F419 Anxiety disorder, unspecified: Secondary | ICD-10-CM

## 2020-10-08 DIAGNOSIS — J309 Allergic rhinitis, unspecified: Secondary | ICD-10-CM

## 2020-10-08 DIAGNOSIS — E663 Overweight: Secondary | ICD-10-CM | POA: Insufficient documentation

## 2020-10-08 DIAGNOSIS — Z1231 Encounter for screening mammogram for malignant neoplasm of breast: Secondary | ICD-10-CM

## 2020-10-08 DIAGNOSIS — F900 Attention-deficit hyperactivity disorder, predominantly inattentive type: Secondary | ICD-10-CM

## 2020-10-08 DIAGNOSIS — R4586 Emotional lability: Secondary | ICD-10-CM

## 2020-10-08 DIAGNOSIS — J302 Other seasonal allergic rhinitis: Secondary | ICD-10-CM | POA: Insufficient documentation

## 2020-10-08 MED ORDER — LEVOCETIRIZINE DIHYDROCHLORIDE 5 MG PO TABS
5.0000 mg | ORAL_TABLET | Freq: Every evening | ORAL | 1 refills | Status: DC
Start: 2020-10-08 — End: 2020-10-08

## 2020-10-08 MED ORDER — AMPHETAMINE-DEXTROAMPHETAMINE 30 MG PO TABS: 30.0000 mg | ORAL_TABLET | Freq: Two times a day (BID) | ORAL | 0 refills | Status: DC

## 2020-10-08 MED ORDER — FLUTICASONE PROPIONATE 50 MCG/ACT NA SUSP: 2.0000 | Freq: Every day | NASAL | 1 refills | Status: DC

## 2020-10-08 MED ORDER — METHYLPREDNISOLONE SODIUM SUCC 125 MG IJ SOLR
125.0000 mg | Freq: Once | INTRAMUSCULAR | Status: AC
  Administered 2020-10-08: 125 mg via INTRAMUSCULAR

## 2020-10-08 MED ORDER — AZELASTINE HCL 0.05 % OP SOLN: 1.0000 [drp] | Freq: Two times a day (BID) | OPHTHALMIC | 12 refills | Status: DC

## 2020-10-08 MED FILL — AMPHETAMINE-DEXTROAMPHETAMI: 30 | 30 days supply | Qty: 60 | Fill #0

## 2020-10-08 MED FILL — FLUTICASONE PROP 50 MCG SPR: 50 | 30 days supply | Qty: 16 | Fill #0

## 2020-10-08 MED FILL — LEVOCETIRIZINE 5 MG TABLET: 5 | 90 days supply | Qty: 90 | Fill #0

## 2020-10-08 MED FILL — AZELASTINE HCL 0.05 % SOLN: 0.05 | 30 days supply | Qty: 6 | Fill #0

## 2020-10-08 NOTE — Progress Notes (Signed)
Subjective:    Patient ID: Diana Conrad, female    DOB: 08-06-1965, 55 y.o.   MRN: [id number]  HPI  Patient is a 55 year old female with ADHD, seasonal allergies, allergic rhinitis conjunctivitis, anxiety who presents to the clinic for 26-month follow-up.  Patient is doing well on Adderall.  She has no concerns or complaints.  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  She is able to stay focused.  She denies any increase in anxiety.  She is having some increasing problems sleeping.  She does not feel like it is due to the medicine.    She has intermittent allergies that have worsened this week.  She has lot of wateriness, itchiness, ear popping, congestion and rhinorrhea.  She has tried Horticulturist, commercial.  She wonders what else she can do to help with the symptoms.   .. Active Ambulatory Problems    Diagnosis Date Noted  . Depression   . Alcohol abuse   . Anxiety   . Panic attacks   . Current occasional smoker   . Class 1 obesity due to excess calories without serious comorbidity with body mass index (BMI) of 33.0 to 33.9 in adult 07/03/2016  . Attention deficit hyperactivity disorder (ADHD), predominantly inattentive type 07/03/2016  . Hearing impaired person, bilateral 09/29/2016  . Bilateral sensorineural hearing loss 12/24/2016  . Mood changes 06/29/2017  . Breast mass, right 09/07/2018  . Grief 03/29/2019  . Hot flashes 07/10/2020  . Cold intolerance 07/10/2020  . Vitamin D deficiency 07/10/2020  . Allergic conjunctivitis and rhinitis, bilateral 10/08/2020  . Seasonal allergies 10/08/2020  . Overweight (BMI 25.0-29.9) 10/08/2020   Resolved Ambulatory Problems    Diagnosis Date Noted  . No Resolved Ambulatory Problems   Past Medical History:  Diagnosis Date  . Hyperlipidemia   . Stomach ulcer      Review of Systems  All other systems reviewed and are negative.      Objective:   Physical Exam Vitals reviewed.  Constitutional:       Appearance: Normal appearance.  Cardiovascular:     Rate and Rhythm: Normal rate and regular rhythm.     Pulses: Normal pulses.     Heart sounds: Normal heart sounds.  Neurological:     General: No focal deficit present.     Mental Status: She is alert and oriented to person, place, and time.  Psychiatric:        Mood and Affect: Mood normal.    .. Depression screen [facility] 2/9 10/08/2020 07/10/2020 04/10/2020 01/09/2020 10/09/2019  Decreased Interest 2 1 1 1 2   Down, Depressed, Hopeless 1 1 1 1 2   PHQ - 2 Score 3 2 2 2 4   Altered sleeping 1 1 1 1 2   Tired, decreased energy 1 1 1 1 3   Change in appetite 1 0 1 1 3   Feeling bad or failure about yourself  1 1 0 1 1  Trouble concentrating 0 2 1 1 3   Moving slowly or fidgety/restless 0 0 1 1 0  Suicidal thoughts 0 0 0 0 0  PHQ-9 Score 7 7 7 8 16   Difficult doing work/chores Somewhat difficult - - Somewhat difficult Somewhat difficult  Some encounter information is confidential and restricted. Go to Review Flowsheets activity to see all data.  Some recent data might be hidden   . GAD 7 : Generalized Anxiety Score 10/08/2020 07/10/2020 04/10/2020 01/09/2020  Nervous, Anxious, on Edge 1 1 1 1   Control/stop worrying  1 1 1 1   Worry too much - different things 1 1 1 1   Trouble relaxing 1 1 1 1   Restless 0 0 1 1  Easily annoyed or irritable 1 1 1 1   Afraid - awful might happen 1 2 1 1   Total GAD 7 Score 6 7 7 7   Anxiety Difficulty Somewhat difficult Somewhat difficult - Very difficult  Some encounter information is confidential and restricted. Go to Review Flowsheets activity to see all data.           Assessment & Plan:   Diana Conrad was seen today for adhd.  Diagnoses and all orders for this visit:  Attention deficit hyperactivity disorder (ADHD), predominantly inattentive type -     amphetamine-dextroamphetamine (ADDERALL) 30 MG tablet; Take 1 tablet by mouth 2 (two) times daily. -     amphetamine-dextroamphetamine (ADDERALL) 30 MG tablet;  Take 1 tablet by mouth 2 (two) times daily. -     amphetamine-dextroamphetamine (ADDERALL) 30 MG tablet; Take 1 tablet by mouth 2 (two) times daily.  Encounter for screening mammogram for malignant neoplasm of breast -     MM 3D SCREEN BREAST BILATERAL  Allergic conjunctivitis and rhinitis, bilateral -     azelastine (OPTIVAR) 0.05 % ophthalmic solution; Place 1 drop into both eyes 2 (two) times daily. -     fluticasone (FLONASE) 50 MCG/ACT nasal spray; Place 2 sprays into both nostrils daily. -     levocetirizine (XYZAL) 5 MG tablet; Take 1 tablet (5 mg total) by mouth every evening. -     methylPREDNISolone sodium succinate (SOLU-MEDROL) 125 mg/2 mL injection 125 mg  Seasonal allergies -     azelastine (OPTIVAR) 0.05 % ophthalmic solution; Place 1 drop into both eyes 2 (two) times daily. -     fluticasone (FLONASE) 50 MCG/ACT nasal spray; Place 2 sprays into both nostrils daily. -     levocetirizine (XYZAL) 5 MG tablet; Take 1 tablet (5 mg total) by mouth every evening. -     methylPREDNISolone sodium succinate (SOLU-MEDROL) 125 mg/2 mL injection 125 mg  Mood changes  Anxiety  Overweight (BMI 25.0-29.9)   Adderall refilled for 3 months.  Blood pressure to goal.  Patient is having some problems with sleep and a little increase in anxiety once talking to her.  Discuss cognitive behavioral ways to decrease anxiety.  Discussed sleep hygiene.  Follow-up as needed.  Patient is certainly having some allergy symptoms.  Given Solu-Medrol 125 IM in office today.  Stop Allegra and Zyrtec.  Start Xyzal.  Added Optivar and Flonase.  Follow-up as needed.  Patient has started to notice her weight creep up. .Discussed low carb diet with 1500 calories and 80g of protein.  Exercising at least 150 minutes a week.  My Fitness Pal could be a .  Discussed wegovy.   Follow up in 3 months.

## 2020-10-11 ENCOUNTER — Other Ambulatory Visit: Payer: Self-pay | Admitting: Physician Assistant

## 2020-10-11 DIAGNOSIS — R928 Other abnormal and inconclusive findings on diagnostic imaging of breast: Secondary | ICD-10-CM

## 2020-10-11 DIAGNOSIS — N6310 Unspecified lump in the right breast, unspecified quadrant: Secondary | ICD-10-CM

## 2020-11-19 ENCOUNTER — Encounter: Payer: Self-pay | Admitting: Physician Assistant

## 2020-11-19 NOTE — Telephone Encounter (Signed)
Called patient and tried to schedule an appt, patient denied wanting to make an appt, she just wanted to know which medications OTC to get. Fowarded the call to the triage line. AM

## 2020-11-20 ENCOUNTER — Encounter: Payer: Self-pay | Admitting: Medical-Surgical

## 2020-11-20 ENCOUNTER — Ambulatory Visit (INDEPENDENT_AMBULATORY_CARE_PROVIDER_SITE_OTHER): Payer: BC Managed Care – PPO | Admitting: Medical-Surgical

## 2020-11-20 ENCOUNTER — Other Ambulatory Visit (HOSPITAL_COMMUNITY): Payer: Self-pay

## 2020-11-20 ENCOUNTER — Other Ambulatory Visit: Payer: Self-pay

## 2020-11-20 VITALS — BP 109/66 | HR 80 | Temp 98.0°F | Ht 59.0 in | Wt 145.4 lb

## 2020-11-20 DIAGNOSIS — T7840XA Allergy, unspecified, initial encounter: Secondary | ICD-10-CM | POA: Diagnosis not present

## 2020-11-20 MED ORDER — FAMOTIDINE 20 MG PO TABS
20.0000 mg | ORAL_TABLET | Freq: Two times a day (BID) | ORAL | 0 refills | Status: DC
  Filled 2020-11-20: qty 28, 14d supply, fill #0

## 2020-11-20 MED ORDER — PREDNISONE 10 MG (48) PO TBPK
ORAL_TABLET | Freq: Every day | ORAL | 0 refills | Status: DC
  Filled 2020-11-20: qty 48, 12d supply, fill #0

## 2020-11-20 MED FILL — Ergocalciferol Cap 1.25 MG (50000 Unit): ORAL | 84 days supply | Qty: 12 | Fill #0 | Status: AC

## 2020-11-20 NOTE — Patient Instructions (Signed)
Take Xyzal 5mg  twice daily x 7 days then resume 1 time daily.  Start famotidine 20mg  twice daily for 14 days.  Take Prednisone taper pack as instructed.

## 2020-11-20 NOTE — Progress Notes (Signed)
Subjective:    CC: rash  HPI: Pleasant 55 year old female presenting today for a widespread rash that started 3 days ago after she was outside [employer] over the [place] weekend. Found an area on her right upper breast that seemed to be a bug bite but is not sure what may have bitten her. She first noted the rash when she started itching on her torso and upper arms after her time outside. When she looked, she saw hives has popped up. The rash has covered all 4 extremities, her chest/back/abdomen as well as her scalp/face and buttocks. Has taken Benadryl but it's not helping. Has had an episode similar to this in the past but that was relieved by Benadryl. No known allergies to environmental agents or insect stings. No fevers, chills, swelling of the mouth/throat, exposures to new foods/medications/chemicals/materials, or difficulty breathing.   I reviewed the past medical history, family history, social history, surgical history, and allergies today and no changes were needed.  Please see the problem list section below in epic for further details.  Past Medical History: Past Medical History:  Diagnosis Date  . Alcohol abuse   . Anxiety   . Current occasional smoker   . Depression   . Hyperlipidemia   . Panic attacks   . Stomach ulcer    Past Surgical History: Past Surgical History:  Procedure Laterality Date  . HERNIA REPAIR     Social History: Social History   Socioeconomic History  . Marital status: Divorced    Spouse name: Not on file  . Number of children: Not on file  . Years of education: Not on file  . Highest education level: Not on file  Occupational History  . Not on file  Tobacco Use  . Smoking status: Current Some Day Smoker  . Smokeless tobacco: Never Used  . Tobacco comment: has quit in past. smokes with drinking  Vaping Use  . Vaping Use: Never used  Substance and Sexual Activity  . Alcohol use: Yes    Alcohol/week: 4.0 - 6.0 standard drinks    Types: 4  - 6 Cans of beer per week    Comment: on weekend days   . Drug use: Yes    Types: Marijuana  . Sexual activity: Yes  Other Topics Concern  . Not on file  Social History Narrative  . Not on file   Social Determinants of Health   Financial Resource Strain: Not on file  Food Insecurity: Not on file  Transportation Needs: Not on file  Physical Activity: Not on file  Stress: Not on file  Social Connections: Not on file   Family History: Family History  Problem Relation Age of Onset  . Depression Mother   . Alcohol abuse Father   . Cancer Father        colon  . Diabetes Sister   . Diabetes Brother    Allergies: Allergies  Allergen Reactions  . Sulfa Antibiotics   . Trintellix [Vortioxetine]     Increased appetite.    Medications: See med rec.  Review of Systems: See HPI for pertinent positives and negatives.   Objective:    General: Well Developed, well nourished, and in no acute distress.  Neuro: Alert and oriented x3.  HEENT: Normocephalic, atraumatic.  Skin: Warm and dry. Widespread hives covering extremities, torso, scalp and face.  Cardiac: Regular rate and rhythm, no murmurs rubs or gallops, no lower extremity edema.  Respiratory: Clear to auscultation bilaterally. Not using accessory muscles, speaking  in full sentences.  Impression and Recommendations:    1. Allergic reaction, initial encounter Unfortunately, we do not know what may have bitten her but her symptoms are consistent with an allergic reaction. Start Prednisone taper. Take Xyzal twice daily for 1 week then resume once daily dosing. Start famotidine 20mg  twice daily. Ok to use topical benadryl or calamine lotion for itching as needed. If this sort of thing recurs, may benefit from a referral to an allergist for testing.   Return if symptoms worsen or fail to improve. ___________________________________________ , DNP, APRN, FNP-BC Primary Care and Sports Medicine [facility]  [place]

## 2020-11-21 ENCOUNTER — Telehealth: Payer: Self-pay

## 2020-11-21 NOTE — Telephone Encounter (Signed)
Pt called regarding the Rx for prednisone she was given yesterday. Pt states the sig for todays dosing was 2 tablets at breakfast and 1 tablet at lunch. Pt states she has to work today and thought she may not remember to take the tablet at lunch so she took all 3 tablets this morning. Some of her coworkers told her she should not have dont that and that she may need to go have her stomach pumped. Pt is wanting to know if she should do that. Per verbal instruction by Ander Slade, pt should be fine for today. Pt aware and instructed to return to the normal prescribed dosing tomorrow. Pt instructed to call back if any questions or concerns should arise.

## 2020-11-22 ENCOUNTER — Encounter: Payer: Self-pay | Admitting: Medical-Surgical

## 2020-11-25 ENCOUNTER — Encounter: Payer: Self-pay | Admitting: Physician Assistant

## 2020-11-25 DIAGNOSIS — T7840XA Allergy, unspecified, initial encounter: Secondary | ICD-10-CM

## 2020-11-25 DIAGNOSIS — L509 Urticaria, unspecified: Secondary | ICD-10-CM

## 2020-12-24 NOTE — Progress Notes (Signed)
New Patient Note  RE: Perl Folmar MRN: [id number] DOB: [birth year] Date of Office Visit: 12/25/2020  Consult requested by: Jomarie Longs, PA-C Primary care provider: Jomarie Longs, PA-C  Chief Complaint: Allergy Testing, Urticaria, and Pruritus  History of Present Illness: I had the pleasure of seeing Diana Conrad for initial evaluation at the [facility] on 12/26/2020. She is a 55 y.o. female, who is referred here by Jomarie Longs, PA-C for the evaluation of rash.  Patient broke ou in a rash for the first time in November 2021. She was driving from her mother's house. She took benadryl which helped. The rash was mainly on her upper torso and legs. She may have had a fever but not sure and no other associated symptoms. She thinks she may have bitten by something.   Denies any changes in medications, foods, personal care products or recent infections.  Patient broke out in Easter Sunday again. She was outside grilling steaks and hula hooping. She noted a little itchy rash on her torso. Then she started to itch everywhere again. She took some benadryl and topical cortisone with no benefit. She woke up at 2AM and the itching spread throughout her body and had rash everywhere. She did not east steak this day.   She went to see her PCP the following day and was given prednisone, Xyzal and famotidine. Symptoms improved slowly and it took about 2 weeks for the symptoms to resolve.   Currently on no daily antihistamines and no worsening symptoms.  Previous work up includes: none. Previous history of rash/hives: no. Patient is up to date with the following cancer screening tests: pap smear, mammogram, colonoscopy.  11/20/2020 PCP visit: "Pleasant 55 year old female presenting today for a widespread rash that started 3 days ago after she was outside [employer] over the [place] weekend. Found an area on her right upper breast that seemed to be a bug bite  but is not sure what may have bitten her. She first noted the rash when she started itching on her torso and upper arms after her time outside. When she looked, she saw hives has popped up. The rash has covered all 4 extremities, her chest/back/abdomen as well as her scalp/face and buttocks. Has taken Benadryl but it's not helping. Has had an episode similar to this in the past but that was relieved by Benadryl. No known allergies to environmental agents or insect stings. No fevers, chills, swelling of the mouth/throat, exposures to new foods/medications/chemicals/materials, or difficulty breathing."  Assessment and Plan: Jahnaya is a 55 y.o. female with: Rash and other nonspecific skin eruption 2 episodes of rash outbreak with unknown triggers. Concerned about possible insect bites but also was outside during those times. Symptoms eventually resolved with antihistamines and prednisone. Denies changes in diet, meds or personal care products.   Today's skin testing showed: Positive to grass, weed pollen, trees, dust mites, cat, dog, horse, cockroach and mouse. Borderline to mold and fire ant. Negative to common foods.  Given the above clinical history and test results not sure what may have triggered these episodes.  Take pictures when it happens the next time and write down what you had come across/eaten that day.  If it's happening more often then we will get bloodwork next.  If you notice itching/rash again:  THEN RESTART Xyzal 5mg  in the evening.   Other allergic rhinitis Perennial symptoms which is worse outdoors and around cats. Takes Xyzal, Flonase and optivar  eye drops prn with good benefit.  Today's skin testing showed: Positive to grass, weed pollen, trees, dust mites, cat, dog, horse, cockroach and mouse. Borderline to mold and fire ant.  Start environmental control measures as below.  Use over the counter antihistamines such as Zyrtec (cetirizine), Claritin (loratadine), Allegra  (fexofenadine), or Xyzal (levocetirizine) daily as needed. May take twice a day during allergy flares. May switch antihistamines every few months.  Use Flonase (fluticasone) nasal spray 1 spray per nostril twice a day as needed for nasal congestion.   Nasal saline spray (i.e., Simply Saline) or nasal saline lavage (i.e., NeilMed) is recommended as needed and prior to medicated nasal sprays.  Use Optivar (azelastine) eye drops 0.05% twice a day as needed for itchy/watery eyes.  Consider allergy injections for long term control if above medications do not help the symptoms - handout given.   Allergic conjunctivitis of both eyes  See assessment and plan as above.  Return in about 2 months (around 02/24/2021).  Meds ordered this encounter  Medications  . levocetirizine (XYZAL) 5 MG tablet    Sig: Take 1 tablet (5 mg total) by mouth every evening.    Dispense:  30 tablet    Refill:  5   Lab Orders  No laboratory test(s) ordered today    Other allergy screening: Asthma: no  Sometimes uses albuterol during URIs Rhino conjunctivitis: yes  Perennial symptoms. Worse with outdoors, around cats. Takes Xyzal, Flonase, optivar prn with good benefit.   Food allergy: no Medication allergy: yes Hymenoptera allergy: no History of recurrent infections suggestive of immunodeficency: no  Diagnostics: Skin Testing: Environmental allergy panel and select foods. Positive to grass, weed pollen, trees, dust mites, cat, dog, horse, cockroach and mouse. Borderline to mold and fire ant. Negative to common foods. Results discussed with patient/family.  Airborne Adult Perc - 12/25/20 1408    Time Antigen Placed 1408    Allergen Manufacturer Waynette Buttery    Location Back    Number of Test 59    1. Control-Buffer 50% Glycerol Negative    2. Control-Histamine 1 mg/ml 2+    3. Albumin saline Negative    4. Bahia Negative    5. French Southern Territories Negative    6. Johnson Negative    7. Kentucky Blue Negative    8.  Meadow Fescue Negative    9. Perennial Rye Negative    10. Sweet Vernal Negative    11. Timothy Negative    12. Cocklebur 2+    13. Burweed Marshelder Negative    14. Ragweed, short Negative    15. Ragweed, Giant Negative    16. Plantain,  English Negative    17. Lamb's Quarters Negative    18. Sheep Sorrell Negative    19. Rough Pigweed Negative    20. Marsh Elder, Rough Negative    21. Mugwort, Common 2+    22. Ash mix Negative    23. Birch mix 2+    24. Van Clines American --   +/-   25. Box, Elder 2+    26. Cedar, red Negative    27. Cottonwood, Guinea-Bissau Negative    28. Elm mix 2+    29. Hickory 4+    30. Maple mix 2+    31. Oak, Guinea-Bissau mix 2+    32. Pecan Pollen 3+    33. Pine mix Negative    34. Sycamore Eastern Negative    35. Walnut, Black Pollen --   +/-   36. Alternaria alternata  Negative    37. Cladosporium Herbarum Negative    38. Aspergillus mix Negative    39. Penicillium mix Negative    40. Bipolaris sorokiniana (Helminthosporium) Negative    41. Drechslera spicifera (Curvularia) Negative    42. Mucor plumbeus --   +/-   43. Fusarium moniliforme Negative    44. Aureobasidium pullulans (pullulara) Negative    45. Rhizopus oryzae Negative    46. Botrytis cinera Negative    47. Epicoccum nigrum --   +/-   48. Phoma betae --   +/-   49. Candida Albicans Negative    50. Trichophyton mentagrophytes Negative    51. Mite, D Farinae  5,000 AU/ml Negative    52. Mite, D Pteronyssinus  5,000 AU/ml 2+    53. Cat Hair 10,000 BAU/ml 4+    54.  Dog Epithelia Negative    55. Mixed Feathers Negative    56. Horse Epithelia 2+    57. Cockroach, German 3+    58. Mouse 2+    59. Tobacco Leaf Negative    Other --   Animator  +/-         Food Perc - 12/25/20 1508      Test Information   Time Antigen Placed --    Allergen Manufacturer Waynette Buttery    Location Back      Food   1. Peanut Negative    2. Soybean food Negative    3. Wheat, whole Negative    4. Sesame Negative     5. Milk, cow Negative    6. Egg White, chicken Negative    7. Casein Negative     8. Shellfish mix Negative     9. Fish mix Negative    10. Cashew Negative           Intradermal - 12/25/20 1540    Time Antigen Placed 1540    Allergen Manufacturer Waynette Buttery    Location Arm    Number of Test 9    Intradermal Select    Control Negative    French Southern Territories Negative    Johnson 2+    7 Grass Negative    Ragweed mix Negative    Mold 1 Negative    Mold 2 Negative    Mold 4 Negative    Dog 3+          Food Adult Perc - 12/25/20 1400    Time Antigen Placed 1409    Allergen Manufacturer Waynette Buttery    Location Back    Number of allergen test 2           Past Medical History: Patient Active Problem List   Diagnosis Date Noted  . Rash and other nonspecific skin eruption 12/25/2020  . Other allergic rhinitis 12/25/2020  . Hives 11/25/2020  . Allergic conjunctivitis of both eyes 10/08/2020  . Seasonal allergies 10/08/2020  . Overweight (BMI 25.0-29.9) 10/08/2020  . Hot flashes 07/10/2020  . Cold intolerance 07/10/2020  . Vitamin D deficiency 07/10/2020  . Grief 03/29/2019  . Breast mass, right 09/07/2018  . Mood changes 06/29/2017  . Bilateral sensorineural hearing loss 12/24/2016  . Hearing impaired person, bilateral 09/29/2016  . Class 1 obesity due to excess calories without serious comorbidity with body mass index (BMI) of 33.0 to 33.9 in adult 07/03/2016  . Attention deficit hyperactivity disorder (ADHD), predominantly inattentive type 07/03/2016  . Depression   . Alcohol abuse   . Anxiety   . Panic attacks   . Current  occasional smoker    Past Medical History:  Diagnosis Date  . Alcohol abuse   . Anxiety   . Current occasional smoker   . Depression   . Hyperlipidemia   . Panic attacks   . Stomach ulcer   . Urticaria    Past Surgical History: Past Surgical History:  Procedure Laterality Date  . HERNIA REPAIR     Medication List:  Current Outpatient Medications   Medication Sig Dispense Refill  . amphetamine-dextroamphetamine (ADDERALL) 30 MG tablet Take 1 tablet by mouth 2 (two) times daily. 60 tablet 0  . amphetamine-dextroamphetamine (ADDERALL) 30 MG tablet Take 1 tablet by mouth 2 (two) times daily. 60 tablet 0  . amphetamine-dextroamphetamine (ADDERALL) 30 MG tablet TAKE 1 TABLET BY MOUTH 2 TIMES DAILY - EFFECTIVE DATE 12/08/20 60 tablet 0  . amphetamine-dextroamphetamine (ADDERALL) 30 MG tablet TAKE 1 TABLET BY MOUTH 2 TIMES DAILY - EFFECTIVE DATE 11/07/20. 60 tablet 0  . amphetamine-dextroamphetamine (ADDERALL) 30 MG tablet TAKE 1 TABLET BY MOUTH 2 TIMES DAILY 60 tablet 0  . azelastine (OPTIVAR) 0.05 % ophthalmic solution PLACE 1 DROP INTO BOTH EYES 2 TIMES DAILY 6 mL 12  . buPROPion (WELLBUTRIN XL) 300 MG 24 hr tablet Take 1 tablet (300 mg total) by mouth daily. 90 tablet 3  . diazepam (VALIUM) 2 MG tablet TAKE 1 TABLET BY MOUTH EVERY 8 HOURS AS NEEDED FOR ANXIETY. (Patient taking differently: Take by mouth every 8 (eight) hours as needed. for anxiety) 30 tablet 1  . famotidine (PEPCID) 20 MG tablet Take 1 tablet (20 mg total) by mouth 2 (two) times daily. 28 tablet 0  . fluticasone (FLONASE) 50 MCG/ACT nasal spray PLACE 2 SPRAYS INTO BOTH NOSTRILS DAILY 16 g 1  . levocetirizine (XYZAL) 5 MG tablet Take 1 tablet (5 mg total) by mouth every evening. 30 tablet 5  . Vitamin D, Ergocalciferol, (DRISDOL) 1.25 MG (50000 UNIT) CAPS capsule TAKE 1 CAPSULE BY MOUTH EVERY 7 DAYS 12 capsule 1   No current facility-administered medications for this visit.   Allergies: Allergies  Allergen Reactions  . Sulfa Antibiotics   . Trintellix [Vortioxetine]     Increased appetite.    Social History: Social History   Socioeconomic History  . Marital status: Divorced    Spouse name: Not on file  . Number of children: Not on file  . Years of education: Not on file  . Highest education level: Not on file  Occupational History  . Not on file  Tobacco Use  .  Smoking status: Current Some Day Smoker  . Smokeless tobacco: Never Used  . Tobacco comment: has quit in past. smokes with drinking  Vaping Use  . Vaping Use: Never used  Substance and Sexual Activity  . Alcohol use: Yes    Alcohol/week: 4.0 - 6.0 standard drinks    Types: 4 - 6 Cans of beer per week    Comment: on weekend days   . Drug use: Yes    Types: Marijuana  . Sexual activity: Yes  Other Topics Concern  . Not on file  Social History Narrative  . Not on file   Social Determinants of Health   Financial Resource Strain: Not on file  Food Insecurity: Not on file  Transportation Needs: Not on file  Physical Activity: Not on file  Stress: Not on file  Social Connections: Not on file   Lives in a house. Smoking: on rare occasions.  Occupation: Adult nurse HistorySurveyor, minerals in  the house: no Carpet in the family room: no Carpet in the bedroom: yes Heating: electric Cooling: central Pet: no  Family History: Family History  Problem Relation Age of Onset  . Depression Mother   . Alcohol abuse Father   . Cancer Father        colon  . Diabetes Sister   . Diabetes Brother    Problem                               Relation Asthma                                   Son   Review of Systems  Constitutional: Negative for appetite change, chills, fever and unexpected weight change.  HENT: Positive for congestion.   Eyes: Positive for itching.  Respiratory: Negative for cough, chest tightness, shortness of breath and wheezing.   Cardiovascular: Negative for chest pain.  Gastrointestinal: Negative for abdominal pain.  Genitourinary: Negative for difficulty urinating.  Skin: Positive for rash.  Allergic/Immunologic: Positive for environmental allergies. Negative for food allergies.  Neurological: Negative for headaches.   Objective: BP 104/76 (BP Location: Left Arm, Patient Position: Sitting, Cuff Size: Normal)   Pulse 70   Temp 98 F (36.7 C)  (Temporal)   Resp 18   Ht 4\' 11"  (1.499 m)   Wt 145 lb 12.8 oz (66.1 kg)   SpO2 98%   BMI 29.45 kg/m  Body mass index is 29.45 kg/m. Physical Exam Vitals and nursing note reviewed.  Constitutional:      Appearance: Normal appearance. She is well-developed.  HENT:     Head: Normocephalic and atraumatic.     Right Ear: Tympanic membrane and external ear normal.     Left Ear: Tympanic membrane and external ear normal.     Nose: Nose normal.     Mouth/Throat:     Mouth: Mucous membranes are moist.     Pharynx: Oropharynx is clear.  Eyes:     Conjunctiva/sclera: Conjunctivae normal.  Cardiovascular:     Rate and Rhythm: Normal rate and regular rhythm.     Heart sounds: Normal heart sounds. No murmur heard. No friction rub. No gallop.   Pulmonary:     Effort: Pulmonary effort is normal.     Breath sounds: Normal breath sounds. No wheezing, rhonchi or rales.  Musculoskeletal:     Cervical back: Neck supple.  Skin:    General: Skin is warm.     Findings: No rash.  Neurological:     Mental Status: She is alert and oriented to person, place, and time.  Psychiatric:        Behavior: Behavior normal.    The plan was reviewed with the patient/family, and all questions/concerned were addressed.  It was my pleasure to see Delaney Meigsamara today and participate in her care. Please feel free to contact me with any questions or concerns.  Sincerely,  Wyline MoodYoon Stephani Janak, DO Allergy & Immunology  [facility]  [place] office: [phone] [facility] office: [phone]

## 2020-12-25 ENCOUNTER — Ambulatory Visit (INDEPENDENT_AMBULATORY_CARE_PROVIDER_SITE_OTHER): Payer: BC Managed Care – PPO | Admitting: Allergy

## 2020-12-25 ENCOUNTER — Encounter: Payer: Self-pay | Admitting: Allergy

## 2020-12-25 ENCOUNTER — Other Ambulatory Visit: Payer: Self-pay

## 2020-12-25 ENCOUNTER — Other Ambulatory Visit (HOSPITAL_COMMUNITY): Payer: Self-pay

## 2020-12-25 VITALS — BP 104/76 | HR 70 | Temp 98.0°F | Resp 18 | Ht 59.0 in | Wt 145.8 lb

## 2020-12-25 DIAGNOSIS — R21 Rash and other nonspecific skin eruption: Secondary | ICD-10-CM

## 2020-12-25 DIAGNOSIS — H1013 Acute atopic conjunctivitis, bilateral: Secondary | ICD-10-CM | POA: Diagnosis not present

## 2020-12-25 DIAGNOSIS — L509 Urticaria, unspecified: Secondary | ICD-10-CM

## 2020-12-25 DIAGNOSIS — J3089 Other allergic rhinitis: Secondary | ICD-10-CM | POA: Insufficient documentation

## 2020-12-25 MED ORDER — LEVOCETIRIZINE DIHYDROCHLORIDE 5 MG PO TABS
5.0000 mg | ORAL_TABLET | Freq: Every evening | ORAL | 5 refills | Status: DC
  Filled 2020-12-25 – 2021-01-10 (×2): qty 30, 30d supply, fill #0

## 2020-12-25 NOTE — Patient Instructions (Addendum)
Today's skin testing showed: Positive to grass, weed pollen, trees, dust mites, cat, dog, horse, cockroach and mouse. Borderline to mold and fire ant. Negative to common foods. Results given.   Rash:  Not sure what caused the rash.  Take pictures when it happens the next time and write down what you had come across/eaten that day.  If it's happening more often then we will get bloodwork next.   If you notice itching/rash again:  THEN RESTART Xyzal 5mg  in the evening.   Environmental allergies  Start environmental control measures as below.  Use over the counter antihistamines such as Zyrtec (cetirizine), Claritin (loratadine), Allegra (fexofenadine), or Xyzal (levocetirizine) daily as needed. May take twice a day during allergy flares. May switch antihistamines every few months.  Use Flonase (fluticasone) nasal spray 1 spray per nostril twice a day as needed for nasal congestion.   Nasal saline spray (i.e., Simply Saline) or nasal saline lavage (i.e., NeilMed) is recommended as needed and prior to medicated nasal sprays.  Use Optivar (azelastine) eye drops 0.05% twice a day as needed for itchy/watery eyes.  Consider allergy injections for long term control if above medications do not help the symptoms - handout given.   Follow up in 2 months or sooner if needed.   Reducing Pollen Exposure . Pollen seasons: trees (spring), grass (summer) and ragweed/weeds (fall). 12-10-1981 Keep windows closed in your home and car to lower pollen exposure.  [name] air conditioning in the bedroom and throughout the house if possible.  . Avoid going out in dry windy days - especially early morning. . Pollen counts are highest between 5 - 10 AM and on dry, hot and windy days.  . Save outside activities for late afternoon or after a heavy rain, when pollen levels are lower.  . Avoid mowing of grass if you have grass pollen allergy. Lilian Kapur Be aware that pollen can also be transported indoors on people and  pets.  . Dry your clothes in an automatic dryer rather than hanging them outside where they might collect pollen.  . Rinse hair and eyes before bedtime. Control of House Dust Mite Allergen . Dust mite allergens are a common trigger of allergy and asthma symptoms. While they can be found throughout the house, these microscopic creatures thrive in warm, humid environments such as bedding, upholstered furniture and carpeting. . Because so much time is spent in the bedroom, it is essential to reduce mite levels there.  . Encase pillows, mattresses, and box springs in special allergen-proof fabric covers or airtight, zippered plastic covers.  . Bedding should be washed weekly in hot water (130 F) and dried in a hot dryer. Allergen-proof covers are available for comforters and pillows that can't be regularly washed.  [name] the allergy-proof covers every few months. Minimize clutter in the bedroom. Keep pets out of the bedroom.  Diana Conrad Keep humidity less than 50% by using a dehumidifier or air conditioning. You can buy a humidity measuring device called a hygrometer to monitor this.  . If possible, replace carpets with hardwood, linoleum, or washable area rugs. If that's not possible, vacuum frequently with a vacuum that has a HEPA filter. . Remove all upholstered furniture and non-washable window drapes from the bedroom. . Remove all non-washable stuffed toys from the bedroom.  Wash stuffed toys weekly. Mold Control . Mold and fungi can grow on a variety of surfaces provided certain temperature and moisture conditions exist.  . Outdoor molds grow on plants, decaying vegetation  and soil. The major outdoor mold, Alternaria and Cladosporium, are found in very high numbers during hot and dry conditions. Generally, a late summer - fall peak is seen for common outdoor fungal spores. Rain will temporarily lower outdoor mold spore count, but counts rise rapidly when the rainy period ends. . The most important indoor  molds are Aspergillus and Penicillium. Dark, humid and poorly ventilated basements are ideal sites for mold growth. The next most common sites of mold growth are the bathroom and the kitchen. Outdoor (Seasonal) Mold Control . Use air conditioning and keep windows closed. . Avoid exposure to decaying vegetation. [name] Avoid leaf raking. . Avoid grain handling. . Consider wearing a face mask if working in moldy areas.  Indoor (Perennial) Mold Control  . Maintain humidity below 50%. . Get rid of mold growth on hard surfaces with water, detergent and, if necessary, 5% bleach (do not mix with other cleaners). Then dry the area completely. If mold covers an area more than 10 square feet, consider hiring an indoor environmental professional. . For clothing, washing with soap and water is best. If moldy items cannot be cleaned and dried, throw them away. . Remove sources e.g. contaminated carpets. . Repair and seal leaking roofs or pipes. Using dehumidifiers in damp basements may be helpful, but empty the water and clean units regularly to prevent mildew from forming. All rooms, especially basements, bathrooms and kitchens, require ventilation and cleaning to deter mold and mildew growth. Avoid carpeting on concrete or damp floors, and storing items in damp areas. Pet Allergen Avoidance: . Contrary to popular opinion, there are no "hypoallergenic" breeds of dogs or cats. That is because people are not allergic to an animal's hair, but to an allergen found in the animal's saliva, dander (dead skin flakes) or urine. Pet allergy symptoms typically occur within minutes. For some people, symptoms can build up and become most severe 8 to 12 hours after contact with the animal. People with severe allergies can experience reactions in public places if dander has been transported on the pet owners' clothing. [name] Keeping an animal outdoors is only a partial solution, since homes with pets in the yard still have higher  concentrations of animal allergens. . Before getting a pet, ask your allergist to determine if you are allergic to animals. If your pet is already considered part of your family, try to minimize contact and keep the pet out of the bedroom and other rooms where you spend a great deal of time. . As with dust mites, vacuum carpets often or replace carpet with a hardwood floor, tile or linoleum. . High-efficiency particulate air (HEPA) cleaners can reduce allergen levels over time. . While dander and saliva are the source of cat and dog allergens, urine is the source of allergens from rabbits, hamsters, mice and [place] pigs; so ask a non-allergic family member to clean the animal's cage. . If you have a pet allergy, talk to your allergist about the potential for allergy immunotherapy (allergy shots). This strategy can often provide long-term relief. Cockroach Allergen Avoidance Cockroaches are often found in the homes of densely populated urban areas, schools or commercial buildings, but these creatures can lurk almost anywhere. This does not mean that you have a dirty house or living area. . Block all areas where roaches can enter the home. This includes crevices, wall cracks and windows.  . Cockroaches need water to survive, so fix and seal all leaky faucets and pipes. Have an exterminator go through  the house when your family and pets are gone to eliminate any remaining roaches. [name] Keep food in lidded containers and put pet food dishes away after your pets are done eating. Vacuum and sweep the floor after meals, and take out garbage and recyclables. Use lidded garbage containers in the kitchen. Wash dishes immediately after use and clean under stoves, refrigerators or toasters where crumbs can accumulate. Wipe off the stove and other kitchen surfaces and cupboards regularly.

## 2020-12-25 NOTE — Assessment & Plan Note (Addendum)
Perennial symptoms which is worse outdoors and around cats. Takes Xyzal, Flonase and optivar eye drops prn with good benefit.  Today's skin testing showed: Positive to grass, weed pollen, trees, dust mites, cat, dog, horse, cockroach and mouse. Borderline to mold and fire ant.  Start environmental control measures as below.  Use over the counter antihistamines such as Zyrtec (cetirizine), Claritin (loratadine), Allegra (fexofenadine), or Xyzal (levocetirizine) daily as needed. May take twice a day during allergy flares. May switch antihistamines every few months.  Use Flonase (fluticasone) nasal spray 1 spray per nostril twice a day as needed for nasal congestion.   Nasal saline spray (i.e., Simply Saline) or nasal saline lavage (i.e., NeilMed) is recommended as needed and prior to medicated nasal sprays.  Use Optivar (azelastine) eye drops 0.05% twice a day as needed for itchy/watery eyes.  Consider allergy injections for long term control if above medications do not help the symptoms - handout given.

## 2020-12-25 NOTE — Assessment & Plan Note (Signed)
.   See assessment and plan as above. 

## 2020-12-25 NOTE — Assessment & Plan Note (Addendum)
2 episodes of rash outbreak with unknown triggers. Concerned about possible insect bites but also was outside during those times. Symptoms eventually resolved with antihistamines and prednisone. Denies changes in diet, meds or personal care products.   Today's skin testing showed: Positive to grass, weed pollen, trees, dust mites, cat, dog, horse, cockroach and mouse. Borderline to mold and fire ant. Negative to common foods.  Given the above clinical history and test results not sure what may have triggered these episodes.  Take pictures when it happens the next time and write down what you had come across/eaten that day.  If it's happening more often then we will get bloodwork next.  If you notice itching/rash again:  THEN RESTART Xyzal 5mg  in the evening.

## 2021-01-02 ENCOUNTER — Other Ambulatory Visit (HOSPITAL_COMMUNITY): Payer: Self-pay

## 2021-01-08 ENCOUNTER — Other Ambulatory Visit: Payer: Self-pay

## 2021-01-08 ENCOUNTER — Encounter: Payer: Self-pay | Admitting: Physician Assistant

## 2021-01-08 ENCOUNTER — Ambulatory Visit (INDEPENDENT_AMBULATORY_CARE_PROVIDER_SITE_OTHER): Payer: BC Managed Care – PPO | Admitting: Physician Assistant

## 2021-01-08 ENCOUNTER — Other Ambulatory Visit (HOSPITAL_COMMUNITY): Payer: Self-pay

## 2021-01-08 VITALS — BP 109/69 | HR 73 | Ht 59.0 in | Wt 145.0 lb

## 2021-01-08 DIAGNOSIS — F331 Major depressive disorder, recurrent, moderate: Secondary | ICD-10-CM | POA: Diagnosis not present

## 2021-01-08 DIAGNOSIS — E559 Vitamin D deficiency, unspecified: Secondary | ICD-10-CM | POA: Diagnosis not present

## 2021-01-08 DIAGNOSIS — F900 Attention-deficit hyperactivity disorder, predominantly inattentive type: Secondary | ICD-10-CM | POA: Diagnosis not present

## 2021-01-08 DIAGNOSIS — Z13220 Encounter for screening for lipoid disorders: Secondary | ICD-10-CM

## 2021-01-08 DIAGNOSIS — Z9109 Other allergy status, other than to drugs and biological substances: Secondary | ICD-10-CM

## 2021-01-08 DIAGNOSIS — Z131 Encounter for screening for diabetes mellitus: Secondary | ICD-10-CM

## 2021-01-08 MED ORDER — AMPHETAMINE-DEXTROAMPHETAMINE 30 MG PO TABS: 30.0000 mg | ORAL_TABLET | Freq: Two times a day (BID) | ORAL | 0 refills | Status: DC

## 2021-01-08 MED ORDER — AMPHETAMINE-DEXTROAMPHETAMINE 30 MG PO TABS
30.0000 mg | ORAL_TABLET | Freq: Two times a day (BID) | ORAL | 0 refills | Status: DC
  Filled 2021-01-08: qty 60, 30d supply, fill #0

## 2021-01-08 MED ORDER — AMPHETAMINE-DEXTROAMPHETAMINE 30 MG PO TABS
30.0000 mg | ORAL_TABLET | Freq: Two times a day (BID) | ORAL | 0 refills | Status: DC
  Filled 2021-04-14: qty 60, 30d supply, fill #0

## 2021-01-08 MED ORDER — BUPROPION HCL ER (XL) 300 MG PO TB24
300.0000 mg | ORAL_TABLET | Freq: Every day | ORAL | 3 refills | Status: DC
  Filled 2021-01-08: qty 90, 90d supply, fill #0
  Filled 2021-04-14: qty 90, 90d supply, fill #1
  Filled 2021-06-25: qty 30, 30d supply, fill #1
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
## 2021-01-09 LAB — LIPID PANEL W/REFLEX DIRECT LDL
Cholesterol: 184 mg/dL (ref <200)
HDL: 75 mg/dL (ref >=50)
LDL Cholesterol (Calc): 92 mg/dL (calc)
Non-HDL Cholesterol (Calc): 109 mg/dL (calc) (ref <130)
Total CHOL/HDL Ratio: 2.5 (calc) (ref <5.0)
Triglycerides: 79 mg/dL (ref <150)

## 2021-01-09 LAB — COMPLETE METABOLIC PANEL WITH GFR
AG Ratio: 1.7 (calc) (ref 1.0–2.5)
ALT: 19 U/L (ref 6–29)
AST: 17 U/L (ref 10–35)
Albumin: 4.1 g/dL (ref 3.6–5.1)
Alkaline phosphatase (APISO): 55 U/L (ref 37–153)
BUN: 16 mg/dL (ref 7–25)
CO2: 26 mmol/L (ref 20–32)
Calcium: 9 mg/dL (ref 8.6–10.4)
Chloride: 109 mmol/L (ref 98–110)
Creat: 0.91 mg/dL (ref 0.50–1.05)
GFR, Est African American: 82 mL/min/{1.73_m2} (ref >=60)
GFR, Est Non African American: 71 mL/min/{1.73_m2} (ref >=60)
Globulin: 2.4 g/dL (calc) (ref 1.9–3.7)
Glucose, Bld: 106 mg/dL — ABNORMAL HIGH (ref 65–99)
Potassium: 4.3 mmol/L (ref 3.5–5.3)
Sodium: 141 mmol/L (ref 135–146)
Total Bilirubin: 0.3 mg/dL (ref 0.2–1.2)
Total Protein: 6.5 g/dL (ref 6.1–8.1)

## 2021-01-09 LAB — HEMOGLOBIN A1C

## 2021-01-09 LAB — VITAMIN D 25 HYDROXY (VIT D DEFICIENCY, FRACTURES): Vit D, 25-Hydroxy: 48 ng/mL (ref 30–100)

## 2021-01-09 NOTE — Progress Notes (Signed)
Diana Conrad,   Vitamin D looks much better. Continue on supplementation.  Cholesterol looks great.  Kidney, liver look great.  Fasting glucose is elevated. I would like to get an a1c to better evaluate for diabetes.

## 2021-01-10 ENCOUNTER — Telehealth: Payer: Self-pay | Admitting: *Deleted

## 2021-01-10 ENCOUNTER — Other Ambulatory Visit (HOSPITAL_COMMUNITY): Payer: Self-pay

## 2021-01-10 DIAGNOSIS — R7301 Impaired fasting glucose: Secondary | ICD-10-CM

## 2021-01-10 MED FILL — Fluticasone Propionate Nasal Susp 50 MCG/ACT: NASAL | 30 days supply | Qty: 16 | Fill #0 | Status: AC

## 2021-01-10 NOTE — Telephone Encounter (Signed)
Patient called and left vm asking if she could come in for a nurse visit to have a finger stick instead for her A1C. Please let her know.

## 2021-01-10 NOTE — Telephone Encounter (Signed)
Hgb A1c ordered due to lavender top not drawn at original blood draw.  Pt will come to the lab today.

## 2021-01-10 NOTE — Telephone Encounter (Signed)
LMOM advising pt that doing a nurse visit is fine.  Requested that she call back to schedule.

## 2021-01-14 ENCOUNTER — Other Ambulatory Visit: Payer: Self-pay | Admitting: *Deleted

## 2021-01-14 ENCOUNTER — Encounter: Payer: Self-pay | Admitting: *Deleted

## 2021-01-14 ENCOUNTER — Telehealth: Payer: Self-pay | Admitting: Allergy

## 2021-01-14 DIAGNOSIS — R21 Rash and other nonspecific skin eruption: Secondary | ICD-10-CM

## 2021-01-14 DIAGNOSIS — L509 Urticaria, unspecified: Secondary | ICD-10-CM

## 2021-01-14 MED ORDER — PREDNISONE 10 MG PO TABS
ORAL_TABLET | ORAL | 0 refills | Status: DC
  Filled 2021-01-14: qty 18, 6d supply, fill #0

## 2021-01-14 MED ORDER — MONTELUKAST SODIUM 10 MG PO TABS
10.0000 mg | ORAL_TABLET | Freq: Every day | ORAL | 5 refills | Status: DC
  Filled 2021-01-14: qty 30, 30d supply, fill #0

## 2021-01-14 MED ORDER — FAMOTIDINE 40 MG/5ML PO SUSR
20.0000 mg | Freq: Two times a day (BID) | ORAL | 5 refills | Status: DC
  Filled 2021-01-14: qty 50, 10d supply, fill #0

## 2021-01-14 NOTE — Telephone Encounter (Signed)
Please ask her to add on Allegra twice daily as well as Pepcid 20 mg twice daily and montelukast 10 mg daily.  Please warn her that montelukast can cause irritability and depression, so she does not want to try that I get it.  We can send in prednisone as well:   Take two tablets (20mg ) twice daily for three days, then one tablet (10mg ) twice daily for three days, then STOP.  DISP: 18 tablets  I would anticipate that Dr. would want some blood work, but I will defer for her until she returns tomorrow.  , MD [facility]

## 2021-01-14 NOTE — Telephone Encounter (Signed)
Called and spoke with patient and advised of plan. Patient verbalized understanding. Is there any blood work you would like to do Dr. Selena Batten?

## 2021-01-14 NOTE — Telephone Encounter (Signed)
I called and spoke with the patient it did state per her last office note that if rash/itching started again to start Xyzal 5 mg in the evening. Patient verbalized that she is taking this medication. It seems that other than nasal sprays this is the only antihistamine she is taking. Dr. Selena Batten did mention also that if it keep reoccurring we will do blood work. Can you please provide recommendation since Dr. Selena Batten is out of office?

## 2021-01-14 NOTE — Telephone Encounter (Addendum)
Patient was seen 12/25/2020 and states she is still breaking out in hives all over and she does not know why. Patient is not having any respiratory issues, only the hives. Patient is taking all medications as directed and it only helps a little, but much. She is not sure what the next steps would be, but she is miserable.  Please advise

## 2021-01-15 ENCOUNTER — Other Ambulatory Visit (HOSPITAL_COMMUNITY): Payer: Self-pay

## 2021-01-15 NOTE — Telephone Encounter (Signed)
Labs have been printed and placed in envelope to be mailed to the patient's home. Called and left a voicemail asking for the patient to return call to inform.

## 2021-01-15 NOTE — Telephone Encounter (Signed)
Labs placed. Please print and mail to patient. Thank you.

## 2021-01-23 ENCOUNTER — Other Ambulatory Visit (HOSPITAL_COMMUNITY): Payer: Self-pay

## 2021-02-23 NOTE — Patient Instructions (Addendum)
Rash (negative testing to common foods on 12/25/2020. If you suspect a food we will be glad to do skin testing in the future) Continue Allegra (fexofenadine) once a day. May increase to twice a day if rash returns. Caution as this may make you drowsy Continue Pepcid (famotidine) 20 mg once a day. May increase to twice a day if rash/hives occur Continue Singulair (montelukast) 10 mg once  day Get the lab work that was ordered by Dr. Selena Batten. We will call you with results once they all come back  Seasonal and perennial allergic rhinitis ( grasses, weeds, tree , dust mite, cat, dog, horse, cockroach and mouse. Borderline to mold and fire ant) Use over the counter antihistamines such as Zyrtec (cetirizine), Claritin (loratadine), Allegra (fexofenadine), or Xyzal (levocetirizine) daily as needed. May take twice a day during allergy flares. May switch antihistamines every few months. Use Flonase (fluticasone) nasal spray 1 spray per nostril twice a day as needed for nasal congestion.  Nasal saline spray (i.e., Simply Saline) or nasal saline lavage (i.e., NeilMed) is recommended as needed and prior to medicated nasal sprays. Use Optivar (azelastine) eye drops 0.05% twice a day as needed for itchy/watery eyes. Consider allergy injections for long term control if above medications do not help the symptoms - handout given.   Follow up in 3 months or sooner if needed.

## 2021-02-24 ENCOUNTER — Other Ambulatory Visit: Payer: Self-pay

## 2021-02-24 ENCOUNTER — Encounter: Payer: Self-pay | Admitting: Family

## 2021-02-24 ENCOUNTER — Ambulatory Visit (INDEPENDENT_AMBULATORY_CARE_PROVIDER_SITE_OTHER): Payer: BC Managed Care – PPO | Admitting: Family

## 2021-02-24 VITALS — BP 116/76 | HR 94 | Temp 98.5°F | Resp 16 | Ht 59.0 in | Wt 144.6 lb

## 2021-02-24 DIAGNOSIS — H1013 Acute atopic conjunctivitis, bilateral: Secondary | ICD-10-CM | POA: Diagnosis not present

## 2021-02-24 DIAGNOSIS — J3089 Other allergic rhinitis: Secondary | ICD-10-CM

## 2021-02-24 DIAGNOSIS — R21 Rash and other nonspecific skin eruption: Secondary | ICD-10-CM | POA: Diagnosis not present

## 2021-02-24 NOTE — Progress Notes (Addendum)
[address] [place] [address] Dept: [phone]  FOLLOW UP NOTE  Patient ID: Diana Conrad, female    DOB: [birth year]  Age: 55 y.o. MRN: [id number] Date of Office Visit: 02/24/2021  Assessment  Chief Complaint: Allergic Rhinitis  (Some water eyes and the eye drops have been helping.) and Urticaria (The first week in June had an issue with hives. Dr. Dellis Anes sent in prednisone for 3 days, and a cream it helped. )  HPI Diana Conrad is a 55 year old female who presents today for follow-up of rash and other nonspecific skin eruption, allergic rhinitis, and allergic conjunctivitis.  She was last seen on Dec 25, 2020 by Dr. Selena Batten.  Rashes are reported as controlled at this point time.  She reports that her last outbreak was in the middle of June when she called and Dr. Dellis Anes gave her steroid and a cream to use.  She has pictures on her phone.  She reports with the third event that she was sitting on the couch and studying and she started itching.  She keeps her house pristine and she does not have any bugs.  She did not have any bug bites at the time of the last outbreak.  She denies any changes in diet, medications, or personal care products.  She was not sick at the time.  She denies any fevers or joint pain.  She reports that she had hives really bad on her chest, face, and arms and started to look like tracks from where she had scratched.  As the areas were going away they turned to dark red marks in comparison to them being a light red mark when they started out as.  She thought that she was here to get skin testing to foods today.  Discussed that at the last office visit Dr. Selena Batten did skin testing to common foods and they were negative.  She reports that she is able to eat all foods without any problems.  Discussed that if in the future she should suspect a certain food that we would be glad to do skin testing.  She verbalizes understanding.  She is interested  in getting the lab work that Dr. Selena Batten ordered to find a cause for her rashes since this is her third occurrence.  Allergic rhinitis is reported as moderately controlled with Flonase nasal spray as needed, Allegra once a day, Singulair 10 mg once a day, and Optivar eyedrops as needed.  She reports that she has been out of her office for the past 2 months and will have to go back soon.  She is not certain how her allergies will be in this setting.  Allergic conjunctivitis is reported as moderately controlled with Optivar eyedrops as needed.  She reports occasional itchy watery eyes for which her eyedrops help.   Drug Allergies:  Allergies  Allergen Reactions   Sulfa Antibiotics    Trintellix [Vortioxetine]     Increased appetite.     Review of Systems: Review of Systems  Constitutional:  Negative for chills and fever.  HENT:         Reports occasional rhinorrhea and nasal congestion.  Denies postnasal drip.  Eyes:        Reports occasional itchy watery eyes  Respiratory:  Negative for cough, shortness of breath and wheezing.   Cardiovascular:  Negative for chest pain and palpitations.  Gastrointestinal:        Denies heartburn or reflux symptoms  Genitourinary:  Negative for dysuria.  Skin:  Negative for itching and rash.  Neurological:  Negative for headaches.  Endo/Heme/Allergies:  Positive for environmental allergies.    Physical Exam: BP 116/76   Pulse 94   Temp 98.5 F (36.9 C)   Resp 16   Wt 144 lb 9.6 oz (65.6 kg)   SpO2 97%   BMI 29.21 kg/m    Physical Exam Constitutional:      Appearance: Normal appearance.  HENT:     Head: Normocephalic and atraumatic.     Comments: Pharynx normal, eyes normal, ears normal, nose normal    Right Ear: Tympanic membrane, ear canal and external ear normal.     Left Ear: Tympanic membrane, ear canal and external ear normal.     Nose: Nose normal.     Mouth/Throat:     Mouth: Mucous membranes are moist.     Pharynx: Oropharynx is  clear.  Eyes:     Conjunctiva/sclera: Conjunctivae normal.  Cardiovascular:     Rate and Rhythm: Regular rhythm.     Heart sounds: Normal heart sounds.  Pulmonary:     Effort: Pulmonary effort is normal.     Breath sounds: Normal breath sounds.     Comments: Lungs clear to auscultation Musculoskeletal:     Cervical back: Neck supple.  Skin:    General: Skin is warm.     Comments: No rashes or urticarial lesions noted  Neurological:     Mental Status: She is alert and oriented to person, place, and time.  Psychiatric:        Mood and Affect: Mood normal.        Behavior: Behavior normal.        Thought Content: Thought content normal.        Judgment: Judgment normal.    Diagnostics: None  Assessment and Plan: 1. Rash and other nonspecific skin eruption   2. Other allergic rhinitis   3. Allergic conjunctivitis of both eyes     No orders of the defined types were placed in this encounter.   Patient Instructions  Rash (negative testing to common foods on 12/25/2020. If you suspect a food we will be glad to do skin testing in the future) Continue Allegra (fexofenadine) once a day. May increase to twice a day if rash returns. Caution as this may make you drowsy Continue Pepcid (famotidine) 20 mg once a day. May increase to twice a day if rash/hives occur Continue Singulair (montelukast) 10 mg once  day Get the lab work that was ordered by Dr. Selena Batten. We will call you with results once they all come back  Seasonal and perennial allergic rhinitis ( grasses, weeds, tree , dust mite, cat, dog, horse, cockroach and mouse. Borderline to mold and fire ant) Use over the counter antihistamines such as Zyrtec (cetirizine), Claritin (loratadine), Allegra (fexofenadine), or Xyzal (levocetirizine) daily as needed. May take twice a day during allergy flares. May switch antihistamines every few months. Use Flonase (fluticasone) nasal spray 1 spray per nostril twice a day as needed for nasal  congestion.  Nasal saline spray (i.e., Simply Saline) or nasal saline lavage (i.e., NeilMed) is recommended as needed and prior to medicated nasal sprays. Use Optivar (azelastine) eye drops 0.05% twice a day as needed for itchy/watery eyes. Consider allergy injections for long term control if above medications do not help the symptoms - handout given.   Follow up in 3 months or sooner if needed.      Return in about 3 months (around  05/27/2021), or if symptoms worsen or fail to improve.    Thank you for the opportunity to care for this patient.  Please do not hesitate to contact me with questions.  Nehemiah Settle, FNP [facility]

## 2021-02-28 LAB — CBC WITH DIFFERENTIAL/PLATELET
Basophils Absolute: 0 10*3/uL (ref 0.0–0.2)
Basos: 0 %
EOS (ABSOLUTE): 0.1 10*3/uL (ref 0.0–0.4)
Eos: 1 %
Hematocrit: 40.1 % (ref 34.0–46.6)
Hemoglobin: 13.3 g/dL (ref 11.1–15.9)
Immature Grans (Abs): 0 10*3/uL (ref 0.0–0.1)
Immature Granulocytes: 0 %
Lymphocytes Absolute: 3.1 10*3/uL (ref 0.7–3.1)
Lymphs: 33 %
MCH: 28.5 pg (ref 26.6–33.0)
MCHC: 33.2 g/dL (ref 31.5–35.7)
MCV: 86 fL (ref 79–97)
Monocytes Absolute: 0.5 10*3/uL (ref 0.1–0.9)
Monocytes: 6 %
Neutrophils Absolute: 5.7 10*3/uL (ref 1.4–7.0)
Neutrophils: 60 %
Platelets: 305 10*3/uL (ref 150–450)
RBC: 4.67 x10E6/uL (ref 3.77–5.28)
RDW: 14.8 % (ref 11.7–15.4)
WBC: 9.5 10*3/uL (ref 3.4–10.8)

## 2021-02-28 LAB — ALPHA-GAL PANEL
Allergen Lamb IgE: <0.1 kU/L
Beef IgE: 0.1 kU/L — AB
IgE (Immunoglobulin E), Serum: 255 IU/mL (ref 6–495)
O215-IgE Alpha-Gal: <0.1 kU/L
Pork IgE: <0.1 kU/L

## 2021-02-28 LAB — C-REACTIVE PROTEIN: CRP: 2 mg/L (ref 0–10)

## 2021-02-28 LAB — THYROID CASCADE PROFILE: TSH: 1.07 u[IU]/mL (ref 0.450–4.500)

## 2021-02-28 LAB — CHRONIC URTICARIA: cu index: 2.5 (ref <10)

## 2021-02-28 LAB — ANA W/REFLEX: Anti Nuclear Antibody (ANA): NEGATIVE

## 2021-02-28 LAB — SEDIMENTATION RATE: Sed Rate: 7 mm/hr (ref 0–40)

## 2021-02-28 LAB — TRYPTASE: Tryptase: 5.2 ug/L (ref 2.2–13.2)

## 2021-03-14 ENCOUNTER — Telehealth: Payer: Self-pay

## 2021-03-14 NOTE — Telephone Encounter (Signed)
Tried calling no answer and mailbox full.

## 2021-03-14 NOTE — Telephone Encounter (Signed)
I reviewed the bloodwork. Blood count, thyroid, autoimmune screener, inflammation markers, chronic urticaria index (checks for autoantibodies that trigger mast cells), tryptase (checks for mast cell issues) and alpha gal (checks for red meat allergy) were all normal which is great.  ....   Ellamae Sia, DO  [facility] Please call patient. Mychart message sent regarding results but patient did not view for over 14 days.   I reviewed the bloodwork.  Blood count, thyroid, autoimmune screener, inflammation markers, chronic urticaria index (checks for autoantibodies that trigger mast cells), tryptase (checks for mast cell issues) and alpha gal (checks for red meat allergy) were all normal which is great.     Based on these results there were no triggers found for your rash which is good.  Continue Allegra, famotidine and Singulair.  Keep track of the rashes. Take pictures of them.     Tried to call pt about this but voicemail box is full

## 2021-03-17 NOTE — Telephone Encounter (Signed)
Attempted to call patient to go over lab results. There was no answer and the mailbox was full. Will need to attempt to call again.

## 2021-03-18 NOTE — Telephone Encounter (Signed)
A fourth call has been made to try to contact pt- no answer and unable to leave voicemail. At this point we will wait for pt to reach out to [place].

## 2021-04-14 ENCOUNTER — Other Ambulatory Visit (HOSPITAL_COMMUNITY): Payer: Self-pay

## 2021-04-16 ENCOUNTER — Ambulatory Visit (INDEPENDENT_AMBULATORY_CARE_PROVIDER_SITE_OTHER): Payer: BC Managed Care – PPO | Admitting: Physician Assistant

## 2021-04-16 ENCOUNTER — Other Ambulatory Visit: Payer: Self-pay

## 2021-04-16 ENCOUNTER — Encounter: Payer: Self-pay | Admitting: Physician Assistant

## 2021-04-16 VITALS — BP 110/73 | HR 84 | Temp 98.7°F | Ht 59.0 in | Wt 144.0 lb

## 2021-04-16 DIAGNOSIS — H9313 Tinnitus, bilateral: Secondary | ICD-10-CM | POA: Diagnosis not present

## 2021-04-16 DIAGNOSIS — E559 Vitamin D deficiency, unspecified: Secondary | ICD-10-CM | POA: Diagnosis not present

## 2021-04-16 DIAGNOSIS — F900 Attention-deficit hyperactivity disorder, predominantly inattentive type: Secondary | ICD-10-CM

## 2021-04-16 DIAGNOSIS — H903 Sensorineural hearing loss, bilateral: Secondary | ICD-10-CM | POA: Diagnosis not present

## 2021-04-16 NOTE — Progress Notes (Signed)
Subjective:    Patient ID: Diana Conrad, female    DOB: [birth year], 55 y.o.   MRN: [id number]  HPI Pt is a 55 yo female with ADHD, MDD, anxiety who presents to the clinic for medication refills.   Pt is doing great on Adderall dosage. She is sleeping and denies any increase in anxiety. 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   Denies any SI/HC.   She is having increasing problems hearing. She wants to pursue aids in fall. She is noticing more ringing in ears. This concerns her.   .. Active Ambulatory Problems    Diagnosis Date Noted   Depression    Alcohol abuse    Anxiety    Panic attacks    Current occasional smoker    Class 1 obesity due to excess calories without serious comorbidity with body mass index (BMI) of 33.0 to 33.9 in adult 07/03/2016   Attention deficit hyperactivity disorder (ADHD), predominantly inattentive type 07/03/2016   Hearing impaired person, bilateral 09/29/2016   Bilateral sensorineural hearing loss 12/24/2016   Mood changes 06/29/2017   Breast mass, right 09/07/2018   Grief 03/29/2019   Hot flashes 07/10/2020   Cold intolerance 07/10/2020   Vitamin D deficiency 07/10/2020   Allergic conjunctivitis of both eyes 10/08/2020   Seasonal allergies 10/08/2020   Overweight (BMI 25.0-29.9) 10/08/2020   Hives 11/25/2020   Rash and other nonspecific skin eruption 12/25/2020   Other allergic rhinitis 12/25/2020   Environmental allergies 01/08/2021   Resolved Ambulatory Problems    Diagnosis Date Noted   No Resolved Ambulatory Problems   Past Medical History:  Diagnosis Date   Hyperlipidemia    Stomach ulcer    Urticaria      Review of Systems  All other systems reviewed and are negative.     Objective:   Physical Exam Vitals reviewed.  Constitutional:      Appearance: Normal appearance. She is obese.  HENT:     Head: Normocephalic.     Right Ear: Tympanic membrane, ear canal and external ear  normal. There is no impacted cerumen.     Left Ear: Tympanic membrane, ear canal and external ear normal. There is no impacted cerumen.  Cardiovascular:     Rate and Rhythm: Normal rate and regular rhythm.     Pulses: Normal pulses.     Heart sounds: Normal heart sounds.  Pulmonary:     Effort: Pulmonary effort is normal.     Breath sounds: Normal breath sounds.  Neurological:     General: No focal deficit present.     Mental Status: She is alert.  Psychiatric:        Mood and Affect: Mood normal.          Assessment & Plan:  Marland KitchenMarland KitchenEmelyn was seen today for adhd and hearing problem.  Diagnoses and all orders for this visit:  Attention deficit hyperactivity disorder (ADHD), predominantly inattentive type -     amphetamine-dextroamphetamine (ADDERALL) 30 MG tablet; Take 1 tablet by mouth 2 (two) times daily. -     amphetamine-dextroamphetamine (ADDERALL) 30 MG tablet; Take 1 tablet by mouth 2 (two) times daily. -     amphetamine-dextroamphetamine (ADDERALL) 30 MG tablet; Take 1 tablet by mouth 2 (two) times daily.  Vitamin D deficiency -     Vitamin D, Ergocalciferol, (DRISDOL) 1.25 MG (50000 UNIT) CAPS capsule; TAKE 1 CAPSULE BY MOUTH EVERY 7 DAYS  Bilateral sensorineural hearing  loss  Refilled adderal for 3 months.   Discussed hearing and ringing in ears. No concerns on exam. HO given on tinnitis. She wants to pursue hearing aids in the fall.

## 2021-04-16 NOTE — Patient Instructions (Addendum)
Tinnitus Tinnitus refers to hearing a sound when there is no actual source for that sound. This is often described as ringing in the ears. However, people with this condition may hear a variety of noises, in one ear or in both ears. The sounds of tinnitus can be soft, loud, or somewhere in between. Tinnitus can last for a few seconds or can be constant for days. It may go away without treatment and come back at various times. When tinnitus is constant or happens often, it can lead to other problems, such as trouble sleeping and trouble concentrating. Almost everyone experiences tinnitus at some point. Tinnitus is not the same as hearing loss. Tinnitus that is long-lasting (chronic) or comes back often (recurs) may require medical attention. What are the causes? The cause of tinnitus is often not known. In some cases, it can result from: Exposure to loud noises from machinery, music, or other sources. An object (foreign body) stuck in the ear. Earwax buildup. Drinking alcohol or caffeine. Taking certain medicines. Age-related hearing loss. It may also be caused by medical conditions such as: Ear or sinus infections. Heart diseases or high blood pressure. Allergies. Mnire's disease. Thyroid problems. Tumors. A weak, bulging blood vessel (aneurysm) near the ear. What increases the risk? The following factors may make you more likely to develop this condition: Exposure to loud noises. Age. Tinnitus is more likely in older individuals. Using alcohol or tobacco. What are the signs or symptoms? The main symptom of tinnitus is hearing a sound when there is no source for that sound. It may sound like: Buzzing. Sizzling. Ringing. Blowing air. Hissing. Whistling. Other sounds may include: Roaring. Running water. A musical note. Tapping. Humming. Symptoms may affect only one ear (unilateral) or both ears (bilateral). How is this diagnosed? Tinnitus is diagnosed based on your symptoms,  your medical history, and a physical exam. Your health care provider may do a thorough hearing test (audiologic exam) if your tinnitus: Is unilateral. Causes hearing difficulties. Lasts 6 months or longer. You may work with a health care provider who specializes in hearing disorders (audiologist). You may be asked questions about your symptoms and how they affect your daily life. You may have other tests done, such as: CT scan. MRI. An imaging test of how blood flows through your blood vessels (angiogram). How is this treated? Treating an underlying medical condition can sometimes make tinnitus go away. If your tinnitus continues, other treatments may include: Therapy and counseling to help you manage the stress of living with tinnitus. Sound generators to mask the tinnitus. These include: Tabletop sound machines that play relaxing sounds to help you fall asleep. Wearable devices that fit in your ear and play sounds or music. Acoustic neural stimulation. This involves using headphones to listen to music that contains an auditory signal. Over time, listening to this signal may change some pathways in your brain and make you less sensitive to tinnitus. This treatment is used for very severe cases when no other treatment is working. Using hearing aids or cochlear implants if your tinnitus is related to hearing loss. Hearing aids are worn in the outer ear. Cochlear implants are surgically placed in the inner ear. Follow these instructions at home: Managing symptoms   When possible, avoid being in loud places and being exposed to loud sounds. Wear hearing protection, such as earplugs, when you are exposed to loud noises. Use a white noise machine, a humidifier, or other devices to mask the sound of tinnitus. Practice techniques   for reducing stress, such as meditation, yoga, or deep breathing. Work with your health care provider if you need help with managing stress. Sleep with your head slightly  raised. This may reduce the impact of tinnitus. General instructions Do not use stimulants, such as nicotine, alcohol, or caffeine. Talk with your health care provider about other stimulants to avoid. Stimulants are substances that can make you feel alert and attentive by increasing certain activities in the body (such as heart rate and blood pressure). These substances may make tinnitus worse. Take over-the-counter and prescription medicines only as told by your health care provider. Try to get plenty of sleep each night. Keep all follow-up visits. This is important. Contact a health care provider if: Your tinnitus continues for 3 weeks or longer without stopping. You develop sudden hearing loss. Your symptoms get worse or do not get better with home care. You feel you are not able to manage the stress of living with tinnitus. Get help right away if: You develop tinnitus after a head injury. You have tinnitus along with any of the following: Dizziness. Nausea and vomiting. Loss of balance. Sudden, severe headache. Vision changes. Facial weakness or weakness of arms or legs. These symptoms may represent a serious problem that is an emergency. Do not wait to see if the symptoms will go away. Get medical help right away. Call your local emergency services (911 in the [place] Do not drive yourself to the hospital. Summary Tinnitus refers to hearing a sound when there is no actual source for that sound. This is often described as ringing in the ears. Symptoms may affect only one ear (unilateral) or both ears (bilateral). Use a white noise machine, a humidifier, or other devices to mask the sound of tinnitus. Do not use stimulants, such as nicotine, alcohol, or caffeine. These substances may make tinnitus worse. This information is not intended to replace advice given to you by your health care provider. Make sure you discuss any questions you have with your health care provider. Document  Revised: 06/24/2020 Document Reviewed: 06/24/2020 Elsevier Patient Education  2022 [employer].  

## 2021-04-21 ENCOUNTER — Other Ambulatory Visit (HOSPITAL_COMMUNITY): Payer: Self-pay

## 2021-04-23 ENCOUNTER — Other Ambulatory Visit (HOSPITAL_COMMUNITY): Payer: Self-pay

## 2021-04-23 DIAGNOSIS — H9313 Tinnitus, bilateral: Secondary | ICD-10-CM | POA: Insufficient documentation

## 2021-04-23 MED ORDER — AMPHETAMINE-DEXTROAMPHETAMINE 30 MG PO TABS: 30.0000 mg | ORAL_TABLET | Freq: Two times a day (BID) | ORAL | 0 refills | Status: DC

## 2021-04-23 MED ORDER — AMPHETAMINE-DEXTROAMPHETAMINE 30 MG PO TABS
30.0000 mg | ORAL_TABLET | Freq: Two times a day (BID) | ORAL | 0 refills | Status: DC
Start: 2021-06-14 — End: 2021-07-16

## 2021-04-23 MED ORDER — VITAMIN D (ERGOCALCIFEROL) 1.25 MG (50000 UNIT) PO CAPS
ORAL_CAPSULE | ORAL | 3 refills | Status: DC
Start: 2021-04-23 — End: 2021-10-15
  Filled 2021-04-23 – 2021-08-18 (×2): qty 12, 84d supply, fill #0

## 2021-04-23 MED ORDER — AMPHETAMINE-DEXTROAMPHETAMINE 30 MG PO TABS
30.0000 mg | ORAL_TABLET | Freq: Two times a day (BID) | ORAL | 0 refills | Status: DC
  Filled 2021-04-23 – 2021-06-25 (×2): qty 60, 30d supply, fill #0

## 2021-05-01 ENCOUNTER — Other Ambulatory Visit (HOSPITAL_COMMUNITY): Payer: Self-pay

## 2021-05-30 NOTE — Patient Instructions (Incomplete)
Rash (negative testing to common foods on 12/25/2020) Continue Allegra (fexofenadine) once a day. May increase to twice a day if rash returns. Caution as this may make you drowsy Continue Pepcid (famotidine) 20 mg once a day. May increase to twice a day if rash/hives occur Continue Singulair (montelukast) 10 mg once  day   Seasonal and perennial allergic rhinitis ( grasses, weeds, tree , dust mite, cat, dog, horse, cockroach and mouse. Borderline to mold and fire ant) Use over the counter antihistamines such as Zyrtec (cetirizine), Claritin (loratadine), Allegra (fexofenadine), or Xyzal (levocetirizine) daily as needed. May take twice a day during allergy flares. May switch antihistamines every few months. Use Flonase (fluticasone) nasal spray 1 spray per nostril twice a day as needed for nasal congestion.  Nasal saline spray (i.e., Simply Saline) or nasal saline lavage (i.e., NeilMed) is recommended as needed and prior to medicated nasal sprays. Use Optivar (azelastine) eye drops 0.05% twice a day as needed for itchy/watery eyes. Consider allergy injections for long term control if above medications do not help the symptoms - handout given.   Follow up in 3 months or sooner if needed.

## 2021-06-02 ENCOUNTER — Ambulatory Visit: Payer: BC Managed Care – PPO | Admitting: Family

## 2021-06-02 DIAGNOSIS — J309 Allergic rhinitis, unspecified: Secondary | ICD-10-CM

## 2021-06-25 ENCOUNTER — Other Ambulatory Visit (HOSPITAL_COMMUNITY): Payer: Self-pay

## 2021-07-16 ENCOUNTER — Other Ambulatory Visit: Payer: Self-pay

## 2021-07-16 ENCOUNTER — Other Ambulatory Visit: Payer: Self-pay | Admitting: Physician Assistant

## 2021-07-16 ENCOUNTER — Other Ambulatory Visit (HOSPITAL_COMMUNITY): Payer: Self-pay

## 2021-07-16 ENCOUNTER — Ambulatory Visit: Payer: BC Managed Care – PPO | Admitting: Physician Assistant

## 2021-07-16 ENCOUNTER — Encounter: Payer: Self-pay | Admitting: Physician Assistant

## 2021-07-16 VITALS — BP 114/72 | HR 80 | Ht 59.0 in | Wt 146.0 lb

## 2021-07-16 DIAGNOSIS — F5101 Primary insomnia: Secondary | ICD-10-CM

## 2021-07-16 DIAGNOSIS — H1013 Acute atopic conjunctivitis, bilateral: Secondary | ICD-10-CM | POA: Diagnosis not present

## 2021-07-16 DIAGNOSIS — J302 Other seasonal allergic rhinitis: Secondary | ICD-10-CM

## 2021-07-16 DIAGNOSIS — F439 Reaction to severe stress, unspecified: Secondary | ICD-10-CM | POA: Diagnosis not present

## 2021-07-16 DIAGNOSIS — F900 Attention-deficit hyperactivity disorder, predominantly inattentive type: Secondary | ICD-10-CM | POA: Diagnosis not present

## 2021-07-16 DIAGNOSIS — F331 Major depressive disorder, recurrent, moderate: Secondary | ICD-10-CM

## 2021-07-16 DIAGNOSIS — J309 Allergic rhinitis, unspecified: Secondary | ICD-10-CM

## 2021-07-16 DIAGNOSIS — Z639 Problem related to primary support group, unspecified: Secondary | ICD-10-CM

## 2021-07-16 MED ORDER — MONTELUKAST SODIUM 10 MG PO TABS
10.0000 mg | ORAL_TABLET | Freq: Every day | ORAL | 3 refills | Status: DC
  Filled 2021-07-16: qty 90, 90d supply, fill #0
  Filled 2021-08-18: qty 30, 30d supply, fill #0

## 2021-07-16 MED ORDER — LEVOCETIRIZINE DIHYDROCHLORIDE 5 MG PO TABS
5.0000 mg | ORAL_TABLET | Freq: Every evening | ORAL | 3 refills | Status: DC
  Filled 2021-07-16: qty 90, 90d supply, fill #0
  Filled 2021-08-18: qty 30, 30d supply, fill #0

## 2021-07-16 MED ORDER — BUPROPION HCL ER (XL) 300 MG PO TB24
300.0000 mg | ORAL_TABLET | Freq: Every day | ORAL | 3 refills | Status: DC
  Filled 2021-07-16: qty 90, 90d supply, fill #0
  Filled 2021-08-18: qty 30, 30d supply, fill #0

## 2021-07-16 MED ORDER — AMPHETAMINE-DEXTROAMPHETAMINE 30 MG PO TABS
30.0000 mg | ORAL_TABLET | Freq: Two times a day (BID) | ORAL | 0 refills | Status: DC
Start: 2021-08-15 — End: 2021-10-14

## 2021-07-16 MED ORDER — AMPHETAMINE-DEXTROAMPHETAMINE 30 MG PO TABS
30.0000 mg | ORAL_TABLET | Freq: Two times a day (BID) | ORAL | 0 refills | Status: DC
Start: 2021-09-14 — End: 2021-10-14

## 2021-07-16 MED ORDER — TRAZODONE HCL 50 MG PO TABS
25.0000 mg | ORAL_TABLET | Freq: Every evening | ORAL | 3 refills | Status: DC | PRN
  Filled 2021-07-16 – 2021-08-18 (×2): qty 30, 30d supply, fill #0

## 2021-07-16 MED ORDER — FLUTICASONE PROPIONATE 50 MCG/ACT NA SUSP
2.0000 | Freq: Every day | NASAL | 0 refills | Status: DC
Start: 2021-07-16 — End: 2021-08-18
  Filled 2021-07-16: qty 16, 30d supply, fill #0

## 2021-07-16 MED ORDER — AMPHETAMINE-DEXTROAMPHETAMINE 30 MG PO TABS
30.0000 mg | ORAL_TABLET | Freq: Two times a day (BID) | ORAL | 0 refills | Status: DC
Start: 2021-07-16 — End: 2021-10-14
  Filled 2021-07-16 – 2021-08-18 (×2): qty 60, 30d supply, fill #0

## 2021-07-16 NOTE — Progress Notes (Signed)
Subjective:    Patient ID: Diana Conrad, female    DOB: 1965/12/28, 55 y.o.   MRN: [id number]  HPI Pt is a 55 yo female with ADHD, MDD, allergies who presents to the clinic to follow up and get medication refills.  Adderall doing well no problems or concerns. Needs refills.   She is having a lot of family issues along with hurt and betrayal. It is making her depression hard to manage. No SI/HC.   Continues to have a lot of sinus drainage and congestion. On xyzal daily. Not using flonase regularly.   .. Active Ambulatory Problems    Diagnosis Date Noted   Depression    Alcohol abuse    Anxiety    Panic attacks    Current occasional smoker    Class 1 obesity due to excess calories without serious comorbidity with body mass index (BMI) of 33.0 to 33.9 in adult 07/03/2016   Attention deficit hyperactivity disorder (ADHD), predominantly inattentive type 07/03/2016   Hearing impaired person, bilateral 09/29/2016   Bilateral sensorineural hearing loss 12/24/2016   Mood changes 06/29/2017   Breast mass, right 09/07/2018   Grief 03/29/2019   Hot flashes 07/10/2020   Cold intolerance 07/10/2020   Vitamin D deficiency 07/10/2020   Allergic conjunctivitis of both eyes 10/08/2020   Seasonal allergies 10/08/2020   Overweight (BMI 25.0-29.9) 10/08/2020   Hives 11/25/2020   Rash and other nonspecific skin eruption 12/25/2020   Other allergic rhinitis 12/25/2020   Environmental allergies 01/08/2021   Tinnitus of both ears 04/23/2021   Resolved Ambulatory Problems    Diagnosis Date Noted   No Resolved Ambulatory Problems   Past Medical History:  Diagnosis Date   Hyperlipidemia    Stomach ulcer    Urticaria      Review of Systems See HPI.     Objective:   Physical Exam Vitals reviewed.  Constitutional:      Appearance: Normal appearance.  HENT:     Head: Normocephalic.  Neck:     Vascular: No carotid bruit.  Cardiovascular:     Rate and Rhythm: Normal rate  and regular rhythm.     Pulses: Normal pulses.     Heart sounds: Normal heart sounds.  Pulmonary:     Effort: Pulmonary effort is normal.     Breath sounds: Normal breath sounds.  Musculoskeletal:     Right lower leg: No edema.     Left lower leg: No edema.  Neurological:     General: No focal deficit present.     Mental Status: She is alert and oriented to person, place, and time.  Psychiatric:        Mood and Affect: Mood normal.   .. Depression screen [facility] 2/9 07/16/2021 04/16/2021 01/08/2021 10/08/2020 07/10/2020  Decreased Interest 3 2 1 2 1   Down, Depressed, Hopeless 3 1 1 1 1   PHQ - 2 Score 6 3 2 3 2   Altered sleeping 3 3 1 1 1   Tired, decreased energy 3 2 3 1 1   Change in appetite 3 2 3 1  0  Feeling bad or failure about yourself  2 1 1 1 1   Trouble concentrating 2 3 1  0 2  Moving slowly or fidgety/restless 0 0 0 0 0  Suicidal thoughts 0 0 0 0 0  PHQ-9 Score 19 14 11 7 7   Difficult doing work/chores Very difficult Somewhat difficult Somewhat difficult Somewhat difficult -  Some encounter information is confidential and restricted. Go to Review  Flowsheets activity to see all data.  Some recent data might be hidden   .[name] GAD 7 : Generalized Anxiety Score 07/16/2021 04/16/2021 01/08/2021 10/08/2020  Nervous, Anxious, on Edge 2 1 1 1   Control/stop worrying 2 2 1 1   Worry too much - different things 2 2 1 1   Trouble relaxing 2 2 1 1   Restless 2 1 0 0  Easily annoyed or irritable 2 2 1 1   Afraid - awful might happen 2 2 1 1   Total GAD 7 Score 14 12 6 6   Anxiety Difficulty Very difficult Not difficult at all Somewhat difficult Somewhat difficult  Some encounter information is confidential and restricted. Go to Review Flowsheets activity to see all data.            Assessment & Plan:   Elynore was seen today for adhd.  Diagnoses and all orders for this visit:  Stress -     Ambulatory referral to Psychology  Attention deficit hyperactivity disorder (ADHD), predominantly  inattentive type -     amphetamine-dextroamphetamine (ADDERALL) 30 MG tablet; Take 1 tablet by mouth 2 (two) times daily. -     amphetamine-dextroamphetamine (ADDERALL) 30 MG tablet; Take 1 tablet by mouth 2 (two) times daily.  **08/15/2021 -     amphetamine-dextroamphetamine (ADDERALL) 30 MG tablet; Take 1 tablet by mouth 2 (two) times daily.  Allergic conjunctivitis and rhinitis, bilateral -     fluticasone (FLONASE) 50 MCG/ACT nasal spray; PLACE 2 SPRAYS INTO BOTH NOSTRILS DAILY -     montelukast (SINGULAIR) 10 MG tablet; Take 1 tablet (10 mg total) by mouth at bedtime. -     levocetirizine (XYZAL) 5 MG tablet; Take 1 tablet (5 mg total) by mouth every evening.  Seasonal allergies -     fluticasone (FLONASE) 50 MCG/ACT nasal spray; PLACE 2 SPRAYS INTO BOTH NOSTRILS DAILY  Moderate episode of recurrent major depressive disorder (HCC) -     buPROPion (WELLBUTRIN XL) 300 MG 24 hr tablet; Take 1 tablet (300 mg total) by mouth daily. -     Ambulatory referral to Psychology  Family problems -     Ambulatory referral to Psychology  Primary insomnia -     Ambulatory referral to Psychology -     traZODone (DESYREL) 50 MG tablet; Take 1/2 to 1 tablet (25-50 mg total) by mouth at bedtime as needed for sleep.   Refilled Adderall for 3 months.   Added trazodone for sleep.  PHQ and GAD numbers not to goal Made referral to counseling to discuss stress and how to manage.  Continue wellbutrin.  Discussed adding SSRI.   Singular added to xyzal for allergic rhinitis and congestions. Make sure to use flonase daily

## 2021-07-17 ENCOUNTER — Other Ambulatory Visit (HOSPITAL_COMMUNITY): Payer: Self-pay

## 2021-07-24 ENCOUNTER — Other Ambulatory Visit (HOSPITAL_COMMUNITY): Payer: Self-pay

## 2021-07-29 DIAGNOSIS — F5101 Primary insomnia: Secondary | ICD-10-CM | POA: Insufficient documentation

## 2021-07-29 DIAGNOSIS — Z639 Problem related to primary support group, unspecified: Secondary | ICD-10-CM | POA: Insufficient documentation

## 2021-07-29 DIAGNOSIS — F439 Reaction to severe stress, unspecified: Secondary | ICD-10-CM | POA: Insufficient documentation

## 2021-08-18 ENCOUNTER — Other Ambulatory Visit (HOSPITAL_COMMUNITY): Payer: Self-pay

## 2021-08-18 ENCOUNTER — Other Ambulatory Visit: Payer: Self-pay | Admitting: Physician Assistant

## 2021-08-18 DIAGNOSIS — J302 Other seasonal allergic rhinitis: Secondary | ICD-10-CM

## 2021-08-18 DIAGNOSIS — H1013 Acute atopic conjunctivitis, bilateral: Secondary | ICD-10-CM

## 2021-08-18 MED ORDER — FLUTICASONE PROPIONATE 50 MCG/ACT NA SUSP
2.0000 | Freq: Every day | NASAL | 0 refills | Status: AC
  Filled 2021-08-18: qty 16, 30d supply, fill #0

## 2021-10-15 ENCOUNTER — Encounter: Payer: Self-pay | Admitting: Physician Assistant

## 2021-10-15 ENCOUNTER — Ambulatory Visit: Payer: BC Managed Care – PPO | Admitting: Physician Assistant

## 2021-10-15 ENCOUNTER — Other Ambulatory Visit: Payer: Self-pay

## 2021-10-15 ENCOUNTER — Other Ambulatory Visit (HOSPITAL_COMMUNITY): Payer: Self-pay

## 2021-10-15 VITALS — BP 124/76 | HR 88 | Ht 59.0 in | Wt 144.0 lb

## 2021-10-15 DIAGNOSIS — E559 Vitamin D deficiency, unspecified: Secondary | ICD-10-CM

## 2021-10-15 DIAGNOSIS — F900 Attention-deficit hyperactivity disorder, predominantly inattentive type: Secondary | ICD-10-CM | POA: Diagnosis not present

## 2021-10-15 DIAGNOSIS — Z639 Problem related to primary support group, unspecified: Secondary | ICD-10-CM

## 2021-10-15 DIAGNOSIS — F439 Reaction to severe stress, unspecified: Secondary | ICD-10-CM | POA: Diagnosis not present

## 2021-10-15 DIAGNOSIS — F17200 Nicotine dependence, unspecified, uncomplicated: Secondary | ICD-10-CM

## 2021-10-15 DIAGNOSIS — F331 Major depressive disorder, recurrent, moderate: Secondary | ICD-10-CM | POA: Diagnosis not present

## 2021-10-15 DIAGNOSIS — F419 Anxiety disorder, unspecified: Secondary | ICD-10-CM

## 2021-10-15 DIAGNOSIS — Z122 Encounter for screening for malignant neoplasm of respiratory organs: Secondary | ICD-10-CM

## 2021-10-15 MED ORDER — BUPROPION HCL ER (XL) 300 MG PO TB24
300.0000 mg | ORAL_TABLET | Freq: Every day | ORAL | 3 refills | Status: DC
  Filled 2021-10-15: qty 90, 90d supply, fill #0
  Filled 2021-11-07: qty 30, 30d supply, fill #0
  Filled 2022-01-27: qty 30, 30d supply, fill #1
  Filled 2022-07-29: qty 30, 30d supply, fill #2

## 2021-10-15 MED ORDER — VITAMIN D (ERGOCALCIFEROL) 1.25 MG (50000 UNIT) PO CAPS
ORAL_CAPSULE | ORAL | 3 refills | Status: DC
  Filled 2021-10-15: qty 12, fill #0
  Filled 2021-11-07: qty 12, 84d supply, fill #0
  Filled 2022-07-29: qty 12, 84d supply, fill #1

## 2021-10-15 MED ORDER — AMPHETAMINE-DEXTROAMPHETAMINE 30 MG PO TABS: 30.0000 mg | ORAL_TABLET | Freq: Two times a day (BID) | ORAL | 0 refills | Status: DC

## 2021-10-15 MED ORDER — AMPHETAMINE-DEXTROAMPHETAMINE 30 MG PO TABS
30.0000 mg | ORAL_TABLET | Freq: Two times a day (BID) | ORAL | 0 refills | Status: DC
  Filled 2021-10-15 – 2021-11-13 (×3): qty 60, 30d supply, fill #0

## 2021-10-15 NOTE — Patient Instructions (Addendum)
Will get CT low dose screen for lung cancer.  ?BC for recheck mammogram.  ? ?

## 2021-10-15 NOTE — Progress Notes (Signed)
? ?Subjective:  ? ? Patient ID: Diana Conrad, female    DOB: [birth year], 57 y.o.   MRN: [id number] ? ?HPI ?Pt is a 56 yo female with ADHD, MDD, anxiety who presents to the clinic for medication refills.  ? ?She is doing well with focus. Adderall is doing great. No CP, palpitations, headaches, or vision changes. 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 No significant mood changes. She does have a lot of family stress with her sisters. She feels like she is handling it pretty good.  ? ?Continues to smoke for the last 44 years. ? ? ?.. ?Active Ambulatory Problems  ?  Diagnosis Date Noted  ? Depression   ? Alcohol abuse   ? Anxiety   ? Panic attacks   ? Current occasional smoker   ? Class 1 obesity due to excess calories without serious comorbidity with body mass index (BMI) of 33.0 to 33.9 in adult 07/03/2016  ? Attention deficit hyperactivity disorder (ADHD), predominantly inattentive type 07/03/2016  ? Hearing impaired person, bilateral 09/29/2016  ? Bilateral sensorineural hearing loss 12/24/2016  ? Mood changes 06/29/2017  ? Breast mass, right 09/07/2018  ? Grief 03/29/2019  ? Hot flashes 07/10/2020  ? Cold intolerance 07/10/2020  ? Vitamin D deficiency 07/10/2020  ? Allergic conjunctivitis of both eyes 10/08/2020  ? Seasonal allergies 10/08/2020  ? Overweight (BMI 25.0-29.9) 10/08/2020  ? Hives 11/25/2020  ? Rash and other nonspecific skin eruption 12/25/2020  ? Other allergic rhinitis 12/25/2020  ? Environmental allergies 01/08/2021  ? Tinnitus of both ears 04/23/2021  ? Family problems 07/29/2021  ? Stress 07/29/2021  ? Primary insomnia 07/29/2021  ? Stress at home 10/15/2021  ? ?Resolved Ambulatory Problems  ?  Diagnosis Date Noted  ? No Resolved Ambulatory Problems  ? ?Past Medical History:  ?Diagnosis Date  ? Hyperlipidemia   ? Stomach ulcer   ? Urticaria   ? ? ? ? ?Review of Systems  ?All other systems reviewed and are negative. ? ?   ?Objective:  ? Physical Exam ?Vitals  reviewed.  ?Constitutional:   ?   Appearance: Normal appearance. She is obese.  ?HENT:  ?   Head: Normocephalic.  ?Neck:  ?   Vascular: No carotid bruit.  ?Cardiovascular:  ?   Rate and Rhythm: Normal rate and regular rhythm.  ?   Pulses: Normal pulses.  ?   Heart sounds: Normal heart sounds.  ?Pulmonary:  ?   Effort: Pulmonary effort is normal.  ?   Breath sounds: Normal breath sounds.  ?Musculoskeletal:  ?   Cervical back: Normal range of motion and neck supple.  ?Lymphadenopathy:  ?   Cervical: No cervical adenopathy.  ?Neurological:  ?   General: No focal deficit present.  ?   Mental Status: She is alert and oriented to person, place, and time.  ?Psychiatric:     ?   Mood and Affect: Mood normal.  ? ? ? ?.. ?Depression screen [facility] 2/9 10/15/2021 07/16/2021 04/16/2021 01/08/2021 10/08/2020  ?Decreased Interest 1 3 2 1 2   ?Down, Depressed, Hopeless 0 3 1 1 1   ?PHQ - 2 Score 1 6 3 2 3   ?Altered sleeping 0 3 3 1 1   ?Tired, decreased energy 2 3 2 3 1   ?Change in appetite 3 3 2 3 1   ?Feeling bad or failure about yourself  0 2 1 1 1   ?Trouble concentrating 0 2 3 1  0  ?Moving slowly or fidgety/restless 0  0 0 0 0  ?Suicidal thoughts 0 0 0 0 0  ?PHQ-9 Score 6 19 14 11 7   ?Difficult doing work/chores Somewhat difficult Very difficult Somewhat difficult Somewhat difficult Somewhat difficult  ?Some recent data might be hidden  ? ?.. ?GAD 7 : Generalized Anxiety Score 10/15/2021 07/16/2021 04/16/2021 01/08/2021  ?Nervous, Anxious, on Edge 1 2 1 1   ?Control/stop worrying 3 2 2 1   ?Worry too much - different things 3 2 2 1   ?Trouble relaxing 3 2 2 1   ?Restless 1 2 1  0  ?Easily annoyed or irritable 2 2 2 1   ?Afraid - awful might happen 3 2 2 1   ?Total GAD 7 Score 16 14 12 6   ?Anxiety Difficulty Very difficult Very difficult Not difficult at all Somewhat difficult  ?Some encounter information is confidential and restricted. Go to Review Flowsheets activity to see all data.  ? ? ? ? ?   ?Assessment & Plan:  ?..Robie was seen today for  follow-up. ? ?Diagnoses and all orders for this visit: ? ?Attention deficit hyperactivity disorder (ADHD), predominantly inattentive type ?-     amphetamine-dextroamphetamine (ADDERALL) 30 MG tablet; Take 1 tablet by mouth 2 times daily. ?-     amphetamine-dextroamphetamine (ADDERALL) 30 MG tablet; Take 1 tablet by mouth 2 (two) times daily. ?-     amphetamine-dextroamphetamine (ADDERALL) 30 MG tablet; Take 1 tablet by mouth 2 (two) times daily. ? ?Moderate episode of recurrent major depressive disorder (HCC) ?-     buPROPion (WELLBUTRIN XL) 300 MG 24 hr tablet; Take 1 tablet (300 mg total) by mouth daily. ? ?Vitamin D deficiency ?-     Vitamin D, Ergocalciferol, (DRISDOL) 1.25 MG (50000 UNIT) CAPS capsule; TAKE 1 CAPSULE BY MOUTH EVERY 7 DAYS ? ?Stress at home ? ?Family problems ? ?Current smoker ?-     Ambulatory Referral for Lung Cancer Scre ? ?Screening for lung cancer ?-     Ambulatory Referral for Lung Cancer Scre ? ? ?Adderall refilled for 3 months.  ? ?Wellbutrin refilled for depression and anxiety. ?Declines any medication addition.  ? ?Discussed smoking cessation and lung cancer screening CT. Referral made.  ?Needs to call [facility] and get mammogram scheduled.  ?

## 2021-10-23 ENCOUNTER — Other Ambulatory Visit (HOSPITAL_COMMUNITY): Payer: Self-pay

## 2021-11-07 ENCOUNTER — Other Ambulatory Visit (HOSPITAL_COMMUNITY): Payer: Self-pay

## 2021-11-13 ENCOUNTER — Other Ambulatory Visit (HOSPITAL_COMMUNITY): Payer: Self-pay

## 2021-11-14 ENCOUNTER — Other Ambulatory Visit (HOSPITAL_COMMUNITY): Payer: Self-pay

## 2021-11-19 ENCOUNTER — Other Ambulatory Visit (HOSPITAL_COMMUNITY): Payer: Self-pay

## 2022-01-21 ENCOUNTER — Other Ambulatory Visit (HOSPITAL_COMMUNITY): Payer: Self-pay

## 2022-01-21 ENCOUNTER — Ambulatory Visit (INDEPENDENT_AMBULATORY_CARE_PROVIDER_SITE_OTHER): Payer: BC Managed Care – PPO | Admitting: Physician Assistant

## 2022-01-21 ENCOUNTER — Encounter: Payer: Self-pay | Admitting: Physician Assistant

## 2022-01-21 VITALS — BP 136/71 | HR 76 | Ht 59.0 in | Wt 142.0 lb

## 2022-01-21 DIAGNOSIS — E559 Vitamin D deficiency, unspecified: Secondary | ICD-10-CM

## 2022-01-21 DIAGNOSIS — F900 Attention-deficit hyperactivity disorder, predominantly inattentive type: Secondary | ICD-10-CM

## 2022-01-21 DIAGNOSIS — Z131 Encounter for screening for diabetes mellitus: Secondary | ICD-10-CM

## 2022-01-21 DIAGNOSIS — R7301 Impaired fasting glucose: Secondary | ICD-10-CM

## 2022-01-21 DIAGNOSIS — Z79899 Other long term (current) drug therapy: Secondary | ICD-10-CM

## 2022-01-21 DIAGNOSIS — Z1329 Encounter for screening for other suspected endocrine disorder: Secondary | ICD-10-CM

## 2022-01-21 DIAGNOSIS — Z13220 Encounter for screening for lipoid disorders: Secondary | ICD-10-CM

## 2022-01-21 MED ORDER — AMPHETAMINE-DEXTROAMPHETAMINE 30 MG PO TABS
30.0000 mg | ORAL_TABLET | Freq: Two times a day (BID) | ORAL | 0 refills | Status: DC
  Filled 2022-01-21: qty 60, 30d supply, fill #0

## 2022-01-21 MED ORDER — AMPHETAMINE-DEXTROAMPHETAMINE 30 MG PO TABS: 30.0000 mg | ORAL_TABLET | Freq: Two times a day (BID) | ORAL | 0 refills | Status: DC

## 2022-01-27 ENCOUNTER — Other Ambulatory Visit (HOSPITAL_COMMUNITY): Payer: Self-pay

## 2022-01-27 DIAGNOSIS — R7301 Impaired fasting glucose: Secondary | ICD-10-CM | POA: Insufficient documentation

## 2022-04-24 ENCOUNTER — Ambulatory Visit: Payer: BC Managed Care – PPO | Admitting: Physician Assistant

## 2022-05-18 ENCOUNTER — Ambulatory Visit: Payer: BC Managed Care – PPO | Admitting: Physician Assistant

## 2022-05-18 DIAGNOSIS — R7301 Impaired fasting glucose: Secondary | ICD-10-CM

## 2022-05-18 DIAGNOSIS — F900 Attention-deficit hyperactivity disorder, predominantly inattentive type: Secondary | ICD-10-CM

## 2022-05-18 DIAGNOSIS — Z13220 Encounter for screening for lipoid disorders: Secondary | ICD-10-CM

## 2022-05-18 DIAGNOSIS — E559 Vitamin D deficiency, unspecified: Secondary | ICD-10-CM

## 2022-05-18 DIAGNOSIS — Z131 Encounter for screening for diabetes mellitus: Secondary | ICD-10-CM

## 2022-05-18 DIAGNOSIS — Z79899 Other long term (current) drug therapy: Secondary | ICD-10-CM

## 2022-05-18 DIAGNOSIS — Z1329 Encounter for screening for other suspected endocrine disorder: Secondary | ICD-10-CM

## 2022-07-28 ENCOUNTER — Other Ambulatory Visit: Payer: Self-pay | Admitting: Physician Assistant

## 2022-07-28 ENCOUNTER — Other Ambulatory Visit (HOSPITAL_COMMUNITY): Payer: Self-pay

## 2022-07-28 DIAGNOSIS — F900 Attention-deficit hyperactivity disorder, predominantly inattentive type: Secondary | ICD-10-CM

## 2022-07-29 ENCOUNTER — Other Ambulatory Visit (HOSPITAL_COMMUNITY): Payer: Self-pay

## 2022-07-29 ENCOUNTER — Telehealth: Payer: Self-pay | Admitting: Physician Assistant

## 2022-07-29 ENCOUNTER — Other Ambulatory Visit: Payer: Self-pay | Admitting: Physician Assistant

## 2022-07-29 ENCOUNTER — Other Ambulatory Visit: Payer: Self-pay

## 2022-07-29 DIAGNOSIS — F900 Attention-deficit hyperactivity disorder, predominantly inattentive type: Secondary | ICD-10-CM

## 2022-07-29 DIAGNOSIS — E559 Vitamin D deficiency, unspecified: Secondary | ICD-10-CM

## 2022-07-29 DIAGNOSIS — F331 Major depressive disorder, recurrent, moderate: Secondary | ICD-10-CM

## 2022-07-29 MED ORDER — AMPHETAMINE-DEXTROAMPHETAMINE 30 MG PO TABS
30.0000 mg | ORAL_TABLET | Freq: Two times a day (BID) | ORAL | 0 refills | Status: DC
  Filled 2022-07-29 (×2): qty 60, 30d supply, fill #0

## 2022-07-29 NOTE — Telephone Encounter (Signed)
She has to be seen every 6 months for controlled medications (Adderall) so she will need to schedule a visit to continue getting refills. Thanks.

## 2022-07-29 NOTE — Telephone Encounter (Signed)
Called patient scheduled appointment said financially she can't afford to come but if needed she will. She wants to know if she can get refill for Albuterol, Vitamin D and Adderall?

## 2022-07-29 NOTE — Telephone Encounter (Signed)
Last written  03/21/2022 no refills  Last appt 01/21/2022  No showed appt in October  Please call to schedule patient for ADHD follow up.

## 2022-07-29 NOTE — Telephone Encounter (Signed)
Called patient spoke with her and scheduled an appointment for the patient.

## 2022-07-30 ENCOUNTER — Other Ambulatory Visit: Payer: Self-pay

## 2022-07-30 ENCOUNTER — Other Ambulatory Visit (HOSPITAL_COMMUNITY): Payer: Self-pay

## 2022-07-30 MED ORDER — VITAMIN D (ERGOCALCIFEROL) 1.25 MG (50000 UNIT) PO CAPS
50000.0000 [IU] | ORAL_CAPSULE | ORAL | 0 refills | Status: AC
  Filled 2022-10-24 – 2023-01-29 (×2): qty 12, 84d supply, fill #0

## 2022-07-30 MED ORDER — BUPROPION HCL ER (XL) 300 MG PO TB24
300.0000 mg | ORAL_TABLET | Freq: Every day | ORAL | 0 refills | Status: DC
  Filled 2022-10-24 – 2023-01-29 (×2): qty 90, 90d supply, fill #0

## 2022-07-30 NOTE — Addendum Note (Signed)
Addended bySilvio Pate on: 07/30/2022 10:32 AM   Modules accepted: Orders

## 2022-07-30 NOTE — Telephone Encounter (Signed)
Patient has appt next month

## 2022-07-30 NOTE — Telephone Encounter (Signed)
Spoke with patient. She had asked for Wellbutrin not albuterol. Refill sent.

## 2022-07-30 NOTE — Telephone Encounter (Signed)
Sent Vitamin D, Albuterol not on her medication list or her history.

## 2022-07-30 NOTE — Telephone Encounter (Signed)
Duplicate, it will not let me sign denial. Please sign.

## 2022-07-30 NOTE — Addendum Note (Signed)
Addended byLiliana Cline L on: 07/30/2022 11:01 AM   Modules accepted: Orders

## 2022-07-31 ENCOUNTER — Other Ambulatory Visit (HOSPITAL_COMMUNITY): Payer: Self-pay

## 2022-08-01 ENCOUNTER — Other Ambulatory Visit (HOSPITAL_COMMUNITY): Payer: Self-pay

## 2022-08-10 ENCOUNTER — Other Ambulatory Visit (HOSPITAL_COMMUNITY): Payer: Self-pay

## 2022-08-10 ENCOUNTER — Encounter: Payer: Self-pay | Admitting: Physician Assistant

## 2022-08-10 ENCOUNTER — Ambulatory Visit (INDEPENDENT_AMBULATORY_CARE_PROVIDER_SITE_OTHER): Payer: Self-pay | Admitting: Physician Assistant

## 2022-08-10 VITALS — BP 127/86 | HR 86 | Ht 59.0 in | Wt 138.0 lb

## 2022-08-10 DIAGNOSIS — J3081 Allergic rhinitis due to animal (cat) (dog) hair and dander: Secondary | ICD-10-CM

## 2022-08-10 DIAGNOSIS — R252 Cramp and spasm: Secondary | ICD-10-CM | POA: Insufficient documentation

## 2022-08-10 DIAGNOSIS — Z79899 Other long term (current) drug therapy: Secondary | ICD-10-CM

## 2022-08-10 DIAGNOSIS — F5101 Primary insomnia: Secondary | ICD-10-CM

## 2022-08-10 DIAGNOSIS — R7301 Impaired fasting glucose: Secondary | ICD-10-CM

## 2022-08-10 DIAGNOSIS — Z13220 Encounter for screening for lipoid disorders: Secondary | ICD-10-CM

## 2022-08-10 DIAGNOSIS — H1013 Acute atopic conjunctivitis, bilateral: Secondary | ICD-10-CM

## 2022-08-10 DIAGNOSIS — J309 Allergic rhinitis, unspecified: Secondary | ICD-10-CM

## 2022-08-10 DIAGNOSIS — Z1329 Encounter for screening for other suspected endocrine disorder: Secondary | ICD-10-CM

## 2022-08-10 DIAGNOSIS — E559 Vitamin D deficiency, unspecified: Secondary | ICD-10-CM

## 2022-08-10 DIAGNOSIS — F900 Attention-deficit hyperactivity disorder, predominantly inattentive type: Secondary | ICD-10-CM

## 2022-08-10 MED ORDER — AMPHETAMINE-DEXTROAMPHETAMINE 30 MG PO TABS
30.0000 mg | ORAL_TABLET | Freq: Two times a day (BID) | ORAL | 0 refills | Status: AC
  Filled 2022-08-10 – 2023-01-29 (×3): qty 60, 30d supply, fill #0

## 2022-08-10 MED ORDER — AZELASTINE HCL 0.05 % OP SOLN
2.0000 [drp] | Freq: Two times a day (BID) | OPHTHALMIC | 11 refills | Status: AC
  Filled 2022-08-10: qty 6, 30d supply, fill #0
  Filled 2022-10-24: qty 6, 15d supply, fill #0
  Filled 2023-01-29: qty 6, 30d supply, fill #0

## 2022-08-10 MED ORDER — AMPHETAMINE-DEXTROAMPHETAMINE 30 MG PO TABS: 30.0000 mg | ORAL_TABLET | Freq: Two times a day (BID) | ORAL | 0 refills | Status: AC

## 2022-08-10 MED ORDER — TRAZODONE HCL 50 MG PO TABS
25.0000 mg | ORAL_TABLET | Freq: Every evening | ORAL | 3 refills | Status: AC | PRN
  Filled 2022-08-10: qty 30, 30d supply, fill #0

## 2022-08-10 MED ORDER — LEVOCETIRIZINE DIHYDROCHLORIDE 5 MG PO TABS
5.0000 mg | ORAL_TABLET | Freq: Every evening | ORAL | 3 refills | Status: AC
  Filled 2022-08-10 – 2022-10-24 (×2): qty 90, 90d supply, fill #0

## 2022-08-10 MED ORDER — MONTELUKAST SODIUM 10 MG PO TABS
10.0000 mg | ORAL_TABLET | Freq: Every day | ORAL | 3 refills | Status: AC
  Filled 2022-08-10 – 2022-10-24 (×2): qty 90, 90d supply, fill #0

## 2022-08-10 NOTE — Progress Notes (Signed)
Established Patient Office Visit  Subjective   Patient ID: Emmett Arntz, female    DOB: 1965-11-20  Age: 57 y.o. MRN: [id number]  Chief Complaint  Patient presents with   Follow-up    HPI Pt is a 57 yo female with ADHD, insomnia, allergies who presents to the clinic for follow up and medication refills.   Pt is doing well on adderall. No concerns. 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 No increase in anxiety. She sleeps well with trazodone but needs refills.   She has lots of environmental and seasonal allergies.  She takes Xyzal and Singulair daily.  She does need refills on these.  She does wonder what she can take as needed when she encounters something like cats which she is very allergic to.  She feels like Benadryl makes her too sleepy.  She is also had 2 episodes where her second and third toe on her left foot "get stuck" and hurts up into her shin, no trauma or injury. No discomfort unless it is happening.   .. Active Ambulatory Problems    Diagnosis Date Noted   Depression    Alcohol abuse    Anxiety    Panic attacks    Current occasional smoker    Class 1 obesity due to excess calories without serious comorbidity with body mass index (BMI) of 33.0 to 33.9 in adult 07/03/2016   Attention deficit hyperactivity disorder (ADHD), predominantly inattentive type 07/03/2016   Hearing impaired person, bilateral 09/29/2016   Bilateral sensorineural hearing loss 12/24/2016   Mood changes 06/29/2017   Breast mass, right 09/07/2018   Grief 03/29/2019   Hot flashes 07/10/2020   Cold intolerance 07/10/2020   Vitamin D deficiency 07/10/2020   Allergic conjunctivitis and rhinitis, bilateral 10/08/2020   Seasonal allergies 10/08/2020   Overweight (BMI 25.0-29.9) 10/08/2020   Hives 11/25/2020   Rash and other nonspecific skin eruption 12/25/2020   Other allergic rhinitis 12/25/2020   Environmental allergies 01/08/2021   Tinnitus of  both ears 04/23/2021   Family problems 07/29/2021   Stress 07/29/2021   Primary insomnia 07/29/2021   Stress at home 10/15/2021   Elevated fasting glucose 01/27/2022   Allergy to cats 08/10/2022   Cramp of toe 08/10/2022   Resolved Ambulatory Problems    Diagnosis Date Noted   No Resolved Ambulatory Problems   Past Medical History:  Diagnosis Date   Hyperlipidemia    Stomach ulcer    Urticaria       ROS See HPI.    Objective:     BP 127/86   Pulse 86   Ht 4\' 11"  (1.499 m)   Wt 138 lb (62.6 kg)   SpO2 100%   BMI 27.87 kg/m  BP Readings from Last 3 Encounters:  08/10/22 127/86  01/21/22 136/71  10/15/21 124/76   Wt Readings from Last 3 Encounters:  08/10/22 138 lb (62.6 kg)  01/21/22 142 lb (64.4 kg)  10/15/21 144 lb (65.3 kg)    ..    08/10/2022    8:37 AM 01/21/2022    7:25 AM 10/15/2021    7:39 AM 07/16/2021    7:41 AM 04/16/2021    7:46 AM  Depression screen PHQ 2/9  Decreased Interest 1 0 1 3 2   Down, Depressed, Hopeless 0 0 0 3 1  PHQ - 2 Score 1 0 1 6 3   Altered sleeping 2  0 3 3  Tired, decreased energy 2  2 3 2   Change in appetite 2  3 3 2   Feeling bad or failure about yourself  0  0 2 1  Trouble concentrating 0  0 2 3  Moving slowly or fidgety/restless 0  0 0 0  Suicidal thoughts 0  0 0 0  PHQ-9 Score 7  6 19 14   Difficult doing work/chores Not difficult at all  Somewhat difficult Very difficult Somewhat difficult   ..    08/10/2022    8:37 AM 10/15/2021    7:39 AM 07/16/2021    7:41 AM 04/16/2021    7:46 AM  GAD 7 : Generalized Anxiety Score  Nervous, Anxious, on Edge 2 1 2 1   Control/stop worrying 2 3 2 2   Worry too much - different things 2 3 2 2   Trouble relaxing 2 3 2 2   Restless 0 1 2 1   Easily annoyed or irritable 1 2 2 2   Afraid - awful might happen 1 3 2 2   Total GAD 7 Score 10 16 14 12   Anxiety Difficulty Not difficult at all Very difficult Very difficult Not difficult at all      Physical Exam Vitals reviewed.   Constitutional:      Appearance: Normal appearance.  HENT:     Head: Normocephalic.     Right Ear: Tympanic membrane normal.     Left Ear: Tympanic membrane normal.  Cardiovascular:     Rate and Rhythm: Normal rate and regular rhythm.     Pulses: Normal pulses.     Heart sounds: Normal heart sounds.  Pulmonary:     Effort: Pulmonary effort is normal.     Breath sounds: Normal breath sounds.  Musculoskeletal:     Right lower leg: No edema.     Left lower leg: No edema.     Comments: No pain to palpation over feet or toes.   Neurological:     General: No focal deficit present.     Mental Status: She is alert and oriented to person, place, and time.  Psychiatric:        Mood and Affect: Mood normal.         Assessment & Plan:  3/17/202312/16/2022Marene was seen today for follow-up.  Diagnoses and all orders for this visit:  Attention deficit hyperactivity disorder (ADHD), predominantly inattentive type -     amphetamine-dextroamphetamine (ADDERALL) 30 MG tablet; Take 1 tablet by mouth 2 (two) times daily. -     amphetamine-dextroamphetamine (ADDERALL) 30 MG tablet; Take 1 tablet by mouth 2 (two) times daily. -     amphetamine-dextroamphetamine (ADDERALL) 30 MG tablet; Take 1 tablet by mouth 2 times daily.  Vitamin D deficiency -     Vitamin D (25 hydroxy)  Elevated fasting glucose -     COMPLETE METABOLIC PANEL WITH GFR  Screening for lipid disorders -     Lipid Panel w/reflex Direct LDL  Thyroid disorder screen -     TSH  Medication management -     TSH -     Lipid Panel w/reflex Direct LDL -     COMPLETE METABOLIC PANEL WITH GFR -     CBC with Differential/Platelet -     Vitamin D (25 hydroxy)  Primary insomnia -     traZODone (DESYREL) 50 MG tablet; Take 0.5-1 tablets (25-50 mg total) by mouth at bedtime as needed for sleep.  Allergy to cats -     azelastine (OPTIVAR) 0.05 % ophthalmic solution; Place 2 drops into  both eyes 2 (two) times daily.  Allergic conjunctivitis  and rhinitis, bilateral -     montelukast (SINGULAIR) 10 MG tablet; Take 1 tablet (10 mg total) by mouth at bedtime. -     levocetirizine (XYZAL) 5 MG tablet; Take 1 tablet (5 mg total) by mouth every evening. -     azelastine (OPTIVAR) 0.05 % ophthalmic solution; Place 2 drops into both eyes 2 (two) times daily.  Cramp of toe -     DG Foot Complete Left; Future   Adderall refilled for 3 months.   Fasting labs ordered.   Refilled trazodone for sleep.   Discussed allergies.  Refilled medication.  Add optivar for allergy symptoms when encountering cats. Consider nasal rinse after.   Discussed possible toe cramps Ok to start magnesium at bedtime and drink plenty of water Ordered xray to look at joints Ok to use voltaren gel and icing when needed If symptoms persist or worsen ok to follow up   Iran Planas, PA-C

## 2022-08-21 ENCOUNTER — Other Ambulatory Visit (HOSPITAL_COMMUNITY): Payer: Self-pay

## 2022-10-25 ENCOUNTER — Other Ambulatory Visit: Payer: Self-pay

## 2022-10-26 ENCOUNTER — Other Ambulatory Visit: Payer: Self-pay

## 2022-10-26 ENCOUNTER — Other Ambulatory Visit (HOSPITAL_COMMUNITY): Payer: Self-pay

## 2022-10-28 ENCOUNTER — Other Ambulatory Visit: Payer: Self-pay

## 2022-10-29 ENCOUNTER — Other Ambulatory Visit: Payer: Self-pay

## 2022-10-30 ENCOUNTER — Other Ambulatory Visit (HOSPITAL_COMMUNITY): Payer: Self-pay

## 2022-11-06 ENCOUNTER — Other Ambulatory Visit (HOSPITAL_COMMUNITY): Payer: Self-pay

## 2022-11-09 ENCOUNTER — Ambulatory Visit: Payer: Self-pay | Admitting: Physician Assistant

## 2023-01-29 ENCOUNTER — Other Ambulatory Visit (HOSPITAL_COMMUNITY): Payer: Self-pay

## 2023-04-14 ENCOUNTER — Other Ambulatory Visit: Payer: Self-pay | Admitting: Physician Assistant

## 2023-04-14 ENCOUNTER — Other Ambulatory Visit (HOSPITAL_COMMUNITY): Payer: Self-pay

## 2023-04-14 DIAGNOSIS — F900 Attention-deficit hyperactivity disorder, predominantly inattentive type: Secondary | ICD-10-CM

## 2023-04-14 DIAGNOSIS — F331 Major depressive disorder, recurrent, moderate: Secondary | ICD-10-CM

## 2023-04-16 ENCOUNTER — Other Ambulatory Visit (HOSPITAL_COMMUNITY): Payer: Self-pay

## 2023-04-16 MED ORDER — BUPROPION HCL ER (XL) 300 MG PO TB24
300.0000 mg | ORAL_TABLET | Freq: Every day | ORAL | 0 refills | Status: AC
  Filled 2023-04-16: qty 90, 90d supply, fill #0

## 2023-04-26 ENCOUNTER — Other Ambulatory Visit (HOSPITAL_COMMUNITY): Payer: Self-pay

## 2023-06-22 ENCOUNTER — Other Ambulatory Visit (HOSPITAL_COMMUNITY): Payer: Self-pay

## 2023-09-14 ENCOUNTER — Telehealth: Payer: Self-pay | Admitting: Physician Assistant

## 2023-09-15 NOTE — Telephone Encounter (Signed)
Made in error

## 2023-10-27 ENCOUNTER — Other Ambulatory Visit: Payer: Self-pay | Admitting: Physician Assistant

## 2023-10-27 DIAGNOSIS — J309 Allergic rhinitis, unspecified: Secondary | ICD-10-CM

## 2023-10-27 DIAGNOSIS — H1013 Acute atopic conjunctivitis, bilateral: Secondary | ICD-10-CM

## 2023-10-27 DIAGNOSIS — J3081 Allergic rhinitis due to animal (cat) (dog) hair and dander: Secondary | ICD-10-CM

## 2023-10-27 DIAGNOSIS — J302 Other seasonal allergic rhinitis: Secondary | ICD-10-CM

## 2023-11-06 ENCOUNTER — Other Ambulatory Visit (HOSPITAL_COMMUNITY): Payer: Self-pay

## 2024-06-15 ENCOUNTER — Telehealth: Payer: Self-pay

## 2024-06-15 NOTE — Telephone Encounter (Signed)
 I tried to call patient about needing a diagnostic mammogram with ultrasound. Unable to leave a message. Voicemail is full.
